# Patient Record
Sex: Male | Born: 1955 | Race: Black or African American | Hispanic: No | Marital: Married | Smoking: Never smoker
Health system: Southern US, Community
[De-identification: ages and names within clinical notes are randomized; demographics above are authoritative.]

## PROBLEM LIST (undated history)

## (undated) DIAGNOSIS — E785 Hyperlipidemia, unspecified: Secondary | ICD-10-CM

## (undated) DIAGNOSIS — I10 Essential (primary) hypertension: Secondary | ICD-10-CM

## (undated) DIAGNOSIS — E119 Type 2 diabetes mellitus without complications: Secondary | ICD-10-CM

---

## 1997-07-06 ENCOUNTER — Emergency Department (HOSPITAL_COMMUNITY): Admission: EM | Admit: 1997-07-06 | Discharge: 1997-07-06 | Payer: Self-pay | Admitting: Emergency Medicine

## 1997-08-14 ENCOUNTER — Emergency Department (HOSPITAL_COMMUNITY): Admission: EM | Admit: 1997-08-14 | Discharge: 1997-08-14 | Payer: Self-pay

## 1997-08-21 ENCOUNTER — Encounter: Admission: RE | Admit: 1997-08-21 | Discharge: 1997-11-19 | Payer: Self-pay | Admitting: Family Medicine

## 1998-03-30 ENCOUNTER — Encounter: Payer: Self-pay | Admitting: Family Medicine

## 1998-03-30 ENCOUNTER — Observation Stay (HOSPITAL_COMMUNITY): Admission: EM | Admit: 1998-03-30 | Discharge: 1998-04-01 | Payer: Self-pay | Admitting: Emergency Medicine

## 1998-04-01 ENCOUNTER — Encounter: Payer: Self-pay | Admitting: Interventional Cardiology

## 2005-06-09 ENCOUNTER — Emergency Department (HOSPITAL_COMMUNITY): Admission: EM | Admit: 2005-06-09 | Discharge: 2005-06-10 | Payer: Self-pay | Admitting: Emergency Medicine

## 2007-09-06 ENCOUNTER — Emergency Department: Payer: Self-pay | Admitting: Emergency Medicine

## 2015-04-10 ENCOUNTER — Emergency Department
Admission: EM | Admit: 2015-04-10 | Discharge: 2015-04-10 | Disposition: A | Discharge status: Home / Self Care | Payer: BLUE CROSS/BLUE SHIELD | Attending: Emergency Medicine | Admitting: Emergency Medicine

## 2015-04-10 ENCOUNTER — Encounter: Payer: Self-pay | Admitting: Emergency Medicine

## 2015-04-10 DIAGNOSIS — E1165 Type 2 diabetes mellitus with hyperglycemia: Secondary | ICD-10-CM | POA: Diagnosis present

## 2015-04-10 DIAGNOSIS — E138 Other specified diabetes mellitus with unspecified complications: Secondary | ICD-10-CM

## 2015-04-10 LAB — COMPREHENSIVE METABOLIC PANEL
ALBUMIN: 4 g/dL (ref 3.5–5.0)
ALT: 29 U/L (ref 17–63)
AST: 27 U/L (ref 15–41)
Alkaline Phosphatase: 67 U/L (ref 38–126)
Anion gap: 10 (ref 5–15)
BUN: 13 mg/dL (ref 6–20)
CHLORIDE: 98 mmol/L — AB (ref 101–111)
CO2: 24 mmol/L (ref 22–32)
Calcium: 9.1 mg/dL (ref 8.9–10.3)
Creatinine, Ser: 1.13 mg/dL (ref 0.61–1.24)
GFR calc Af Amer: >60 mL/min (ref >60)
GFR calc non Af Amer: >60 mL/min (ref >60)
GLUCOSE: 412 mg/dL — AB (ref 65–99)
POTASSIUM: 4 mmol/L (ref 3.5–5.1)
SODIUM: 132 mmol/L — AB (ref 135–145)
Total Bilirubin: 0.3 mg/dL (ref 0.3–1.2)
Total Protein: 8 g/dL (ref 6.5–8.1)

## 2015-04-10 LAB — URINALYSIS COMPLETE WITH MICROSCOPIC (ARMC ONLY)
BACTERIA UA: NONE SEEN
Bilirubin Urine: NEGATIVE
Glucose, UA: >500 mg/dL — AB
Hgb urine dipstick: NEGATIVE
LEUKOCYTES UA: NEGATIVE
Nitrite: NEGATIVE
PH: 6 (ref 5.0–8.0)
PROTEIN: NEGATIVE mg/dL
SQUAMOUS EPITHELIAL / LPF: NONE SEEN
Specific Gravity, Urine: 1.027 (ref 1.005–1.030)

## 2015-04-10 LAB — CBC
HEMATOCRIT: 41 % (ref 40.0–52.0)
Hemoglobin: 13.1 g/dL (ref 13.0–18.0)
MCH: 23.1 pg — AB (ref 26.0–34.0)
MCHC: 32 g/dL (ref 32.0–36.0)
MCV: 72.2 fL — AB (ref 80.0–100.0)
Platelets: 470 10*3/uL — ABNORMAL HIGH (ref 150–440)
RBC: 5.68 MIL/uL (ref 4.40–5.90)
RDW: 15.1 % — ABNORMAL HIGH (ref 11.5–14.5)
WBC: 6.3 10*3/uL (ref 3.8–10.6)

## 2015-04-10 LAB — GLUCOSE, CAPILLARY
GLUCOSE-CAPILLARY: 388 mg/dL — AB (ref 65–99)
Glucose-Capillary: 336 mg/dL — ABNORMAL HIGH (ref 65–99)
Glucose-Capillary: 277 mg/dL — ABNORMAL HIGH (ref 65–99)

## 2015-04-10 MED ORDER — SODIUM CHLORIDE 0.9 % IV SOLN
Freq: Once | INTRAVENOUS | Status: AC
  Administered 2015-04-10: 18:00:00 via INTRAVENOUS

## 2015-04-10 MED ORDER — SODIUM CHLORIDE 0.9 % IV SOLN
Freq: Once | INTRAVENOUS | Status: AC
  Administered 2015-04-10: 20:00:00 via INTRAVENOUS

## 2015-04-10 MED ORDER — METFORMIN HCL 500 MG PO TABS: 500.0000 mg | ORAL_TABLET | Freq: Two times a day (BID) | ORAL | Status: DC

## 2015-04-10 NOTE — ED Notes (Signed)
Patient to ER for c/o hyperglycemia (400 range) and HTN. Patient denies history of HTN.

## 2015-04-10 NOTE — Discharge Instructions (Signed)
Please seek medical attention for any high fevers, chest pain, shortness of breath, change in behavior, persistent vomiting, bloody stool or any other new or concerning symptoms.  Type 2 Diabetes Mellitus, Adult Type 2 diabetes mellitus, often simply referred to as type 2 diabetes, is a long-lasting (chronic) disease. In type 2 diabetes, the pancreas does not make enough insulin (a hormone), the cells are less responsive to the insulin that is made (insulin resistance), or both. Normally, insulin moves sugars from food into the tissue cells. The tissue cells use the sugars for energy. The lack of insulin or the lack of normal response to insulin causes excess sugars to build up in the blood instead of going into the tissue cells. As a result, high blood sugar (hyperglycemia) develops. The effect of high sugar (glucose) levels can cause many complications. Type 2 diabetes was also previously called adult-onset diabetes, but it can occur at any age.  RISK FACTORS  A person is predisposed to developing type 2 diabetes if someone in the family has the disease and also has one or more of the following primary risk factors:  Weight gain, or being overweight or obese.  An inactive lifestyle.  A history of consistently eating high-calorie foods. Maintaining a normal weight and regular physical activity can reduce the chance of developing type 2 diabetes. SYMPTOMS  A person with type 2 diabetes may not show symptoms initially. The symptoms of type 2 diabetes appear slowly. The symptoms include:  Increased thirst (polydipsia).  Increased urination (polyuria).  Increased urination during the night (nocturia).  Sudden or unexplained weight changes.  Frequent, recurring infections.  Tiredness (fatigue).  Weakness.  Vision changes, such as blurred vision.  Fruity smell to your breath.  Abdominal pain.  Nausea or vomiting.  Cuts or bruises which are slow to heal.  Tingling or numbness in  the hands or feet.  An open skin wound (ulcer). DIAGNOSIS Type 2 diabetes is frequently not diagnosed until complications of diabetes are present. Type 2 diabetes is diagnosed when symptoms or complications are present and when blood glucose levels are increased. Your blood glucose level may be checked by one or more of the following blood tests:  A fasting blood glucose test. You will not be allowed to eat for at least 8 hours before a blood sample is taken.  A random blood glucose test. Your blood glucose is checked at any time of the day regardless of when you ate.  A hemoglobin A1c blood glucose test. A hemoglobin A1c test provides information about blood glucose control over the previous 3 months.  An oral glucose tolerance test (OGTT). Your blood glucose is measured after you have not eaten (fasted) for 2 hours and then after you drink a glucose-containing beverage. TREATMENT   You may need to take insulin or diabetes medicine daily to keep blood glucose levels in the desired range.  If you use insulin, you may need to adjust the dosage depending on the carbohydrates that you eat with each meal or snack.  Lifestyle changes are recommended as part of your treatment. These may include:  Following an individualized diet plan developed by a nutritionist or dietitian.  Exercising daily. Your health care providers will set individualized treatment goals for you based on your age, your medicines, how long you have had diabetes, and any other medical conditions you have. Generally, the goal of treatment is to maintain the following blood glucose levels:  Before meals (preprandial): 80-130 mg/dL.  After meals (postprandial):  below 180 mg/dL.  A1c: less than 6.5-7%. HOME CARE INSTRUCTIONS   Have your hemoglobin A1c level checked twice a year.  Perform daily blood glucose monitoring as directed by your health care provider.  Monitor urine ketones when you are ill and as directed by  your health care provider.  Take your diabetes medicine or insulin as directed by your health care provider to maintain your blood glucose levels in the desired range.  Never run out of diabetes medicine or insulin. It is needed every day.  If you are using insulin, you may need to adjust the amount of insulin given based on your intake of carbohydrates. Carbohydrates can raise blood glucose levels but need to be included in your diet. Carbohydrates provide vitamins, minerals, and fiber which are an essential part of a healthy diet. Carbohydrates are found in fruits, vegetables, whole grains, dairy products, legumes, and foods containing added sugars.  Eat healthy foods. You should make an appointment to see a registered dietitian to help you create an eating plan that is right for you.  Lose weight if you are overweight.  Carry a medical alert card or wear your medical alert jewelry.  Carry a 15-gram carbohydrate snack with you at all times to treat low blood glucose (hypoglycemia). Some examples of 15-gram carbohydrate snacks include:  Glucose tablets, 3 or 4.  Glucose gel, 15-gram tube.  Raisins, 2 tablespoons (24 grams).  Jelly beans, 6.  Animal crackers, 8.  Regular pop, 4 ounces (120 mL).  Gummy treats, 9.  Recognize hypoglycemia. Hypoglycemia occurs with blood glucose levels of 70 mg/dL and below. The risk for hypoglycemia increases when fasting or skipping meals, during or after intense exercise, and during sleep. Hypoglycemia symptoms can include:  Tremors or shakes.  Decreased ability to concentrate.  Sweating.  Increased heart rate.  Headache.  Dry mouth.  Hunger.  Irritability.  Anxiety.  Restless sleep.  Altered speech or coordination.  Confusion.  Treat hypoglycemia promptly. If you are alert and able to safely swallow, follow the 15:15 rule:  Take 15-20 grams of rapid-acting glucose or carbohydrate. Rapid-acting options include glucose gel,  glucose tablets, or 4 ounces (120 mL) of fruit juice, regular soda, or low-fat milk.  Check your blood glucose level 15 minutes after taking the glucose.  Take 15-20 grams more of glucose if the repeat blood glucose level is still 70 mg/dL or below.  Eat a meal or snack within 1 hour once blood glucose levels return to normal.  Be alert to feeling very thirsty and urinating more frequently than usual, which are early signs of hyperglycemia. An early awareness of hyperglycemia allows for prompt treatment. Treat hyperglycemia as directed by your health care provider.  Engage in at least 150 minutes of moderate-intensity physical activity a week, spread over at least 3 days of the week or as directed by your health care provider. In addition, you should engage in resistance exercise at least 2 times a week or as directed by your health care provider. Try to spend no more than 90 minutes at one time inactive.  Adjust your medicine and food intake as needed if you start a new exercise or sport.  Follow your sick-day plan anytime you are unable to eat or drink as usual.  Do not use any tobacco products including cigarettes, chewing tobacco, or electronic cigarettes. If you need help quitting, ask your health care provider.  Limit alcohol intake to no more than 1 drink per day for nonpregnant women  and 2 drinks per day for men. You should drink alcohol only when you are also eating food. Talk with your health care provider whether alcohol is safe for you. Tell your health care provider if you drink alcohol several times a week.  Keep all follow-up visits as directed by your health care provider. This is important.  Schedule an eye exam soon after the diagnosis of type 2 diabetes and then annually.  Perform daily skin and foot care. Examine your skin and feet daily for cuts, bruises, redness, nail problems, bleeding, blisters, or sores. A foot exam by a health care provider should be done  annually.  Brush your teeth and gums at least twice a day and floss at least once a day. Follow up with your dentist regularly.  Share your diabetes management plan with your workplace or school.  Keep your immunizations up to date. It is recommended that you receive a flu (influenza) vaccine every year. It is also recommended that you receive a pneumonia (pneumococcal) vaccine. If you are 37 years of age or older and have never received a pneumonia vaccine, this vaccine may be given as a series of two separate shots. Ask your health care provider which additional vaccines may be recommended.  Learn to manage stress.  Obtain ongoing diabetes education and support as needed.  Participate in or seek rehabilitation as needed to maintain or improve independence and quality of life. Request a physical or occupational therapy referral if you are having foot or hand numbness, or difficulties with grooming, dressing, eating, or physical activity. SEEK MEDICAL CARE IF:   You are unable to eat food or drink fluids for more than 6 hours.  You have nausea and vomiting for more than 6 hours.  Your blood glucose level is over 240 mg/dL.  There is a change in mental status.  You develop an additional serious illness.  You have diarrhea for more than 6 hours.  You have been sick or have had a fever for a couple of days and are not getting better.  You have pain during any physical activity.  SEEK IMMEDIATE MEDICAL CARE IF:  You have difficulty breathing.  You have moderate to large ketone levels.   This information is not intended to replace advice given to you by your health care provider. Make sure you discuss any questions you have with your health care provider.   Document Released: 01/17/2005 Document Revised: 10/08/2014 Document Reviewed: 08/16/2011 Elsevier Interactive Patient Education [employer]

## 2015-04-10 NOTE — ED Notes (Signed)
Pt denies chest pain. Pt is resting with NAD at this time.

## 2015-04-10 NOTE — ED Notes (Signed)
Patient also states he "blacked out" today. Note from [facility] states A1C today was >13, blood sugar 400.

## 2015-04-10 NOTE — ED Provider Notes (Signed)
[facility] Emergency Department Provider Note   ____________________________________________  Time seen: ~2100  I have reviewed the triage vital signs and the nursing notes.   HISTORY  Chief Complaint Hyperglycemia   History limited by: Not Limited   HPI Danny Todd is a 60 y.o. male who presents to the emergency department today because of concerns for elevated blood sugar and elevated A1c. The patient had these blood tests done by home health nurse.The patient states that for the past month or 2 he has noticed increased thirst and urination. No addition he has had roughly a 20 pound weight loss. Patient states he does have history of diabetes in his family although he himself has never personally been diagnosed with this. Patient does state he has not seen a primary care doctor and a number of years. He denies any abdominal pain. Denies any fevers. Denies any chest pain or shortness breath.    History reviewed. No pertinent past medical history.  There are no active problems to display for this patient.   History reviewed. No pertinent past surgical history.  No current outpatient prescriptions on file.  Allergies Review of patient's allergies indicates no known allergies.  No family history on file.  Social History Social History  Substance Use Topics  . Smoking status: Never Smoker   . Smokeless tobacco: None  . Alcohol Use: No    Review of Systems  Constitutional: Negative for fever. Positive for weight loss. Cardiovascular: Negative for chest pain. Respiratory: Negative for shortness of breath. Gastrointestinal: Negative for abdominal pain, vomiting and diarrhea. Neurological: Negative for headaches, focal weakness or numbness.   10-point ROS otherwise negative.  ____________________________________________   PHYSICAL EXAM:  VITAL SIGNS: ED Triage Vitals  Enc Vitals Group     BP 04/10/15 1745 185/114 mmHg     Pulse  Rate 04/10/15 1745 117     Resp 04/10/15 1854 20     Temp 04/10/15 1745 98.3 F (36.8 C)     Temp Source 04/10/15 1745 Oral     SpO2 04/10/15 1745 96 %     Weight 04/10/15 1745 230 lb (104.327 kg)     Height 04/10/15 1745  (1.753 m)   Constitutional: Alert and oriented. Well appearing and in no distress. Eyes: Conjunctivae are normal. PERRL. Normal extraocular movements. ENT   Head: Normocephalic and atraumatic.   Nose: No congestion/rhinnorhea.   Mouth/Throat: Mucous membranes are moist.   Neck: No stridor. Hematological/Lymphatic/Immunilogical: No cervical lymphadenopathy. Cardiovascular: Normal rate, regular rhythm.  No murmurs, rubs, or gallops. Respiratory: Normal respiratory effort without tachypnea nor retractions. Breath sounds are clear and equal bilaterally. No wheezes/rales/rhonchi. Gastrointestinal: Soft and nontender. No distention. There is no CVA tenderness. Genitourinary: Deferred Musculoskeletal: Normal range of motion in all extremities. No joint effusions.  No lower extremity tenderness nor edema. Neurologic:  Normal speech and language. No gross focal neurologic deficits are appreciated.  Skin:  Skin is warm, dry and intact. No rash noted. Psychiatric: Mood and affect are normal. Speech and behavior are normal. Patient exhibits appropriate insight and judgment.  ____________________________________________    LABS (pertinent positives/negatives)  Labs Reviewed  CBC - Abnormal; Notable for the following:    MCV 72.2 (*)    MCH 23.1 (*)    RDW 15.1 (*)    Platelets 470 (*)    All other components within normal limits  URINALYSIS COMPLETEWITH MICROSCOPIC ([facility] ONLY) - Abnormal; Notable for the following:    Color, Urine STRAW (*)  APPearance CLEAR (*)    Glucose, UA >500 (*)    Ketones, ur TRACE (*)    All other components within normal limits  COMPREHENSIVE METABOLIC PANEL - Abnormal; Notable for the following:    Sodium 132 (*)     Chloride 98 (*)    Glucose, Bld 412 (*)    All other components within normal limits  GLUCOSE, CAPILLARY - Abnormal; Notable for the following:    Glucose-Capillary 388 (*)    All other components within normal limits  GLUCOSE, CAPILLARY - Abnormal; Notable for the following:    Glucose-Capillary 336 (*)    All other components within normal limits  GLUCOSE, CAPILLARY - Abnormal; Notable for the following:    Glucose-Capillary 277 (*)    All other components within normal limits  CBG MONITORING, ED     ____________________________________________   EKG  I, Phineas SemenGraydon Eliakim Tendler, attending physician, personally viewed and interpreted this EKG  EKG Time: 1757 Rate: 100 Rhythm: sinus tachycardia w/ pvc Axis: normal Intervals: qtc 448 QRS: narrow ST changes: no st elevation Impression: abnormal ekg  ____________________________________________    RADIOLOGY  None   ____________________________________________   PROCEDURES  Procedure(s) performed: None  Critical Care performed: No  ____________________________________________   INITIAL IMPRESSION / ASSESSMENT AND PLAN / ED COURSE  Pertinent labs & imaging results that were available during my care of the patient were reviewed by me and considered in my medical decision making (see chart for details).  Patient presented to the emergency department today because of concerns for elevated sugar. Blood work here does show hyperglycemia although no signs of DKA. Patient without a history of diabetes. Will plan on starting patient on metformin. Discussed GI complications. Will additionally give patient resources for primary care follow-up.  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

## 2016-10-21 ENCOUNTER — Inpatient Hospital Stay
Admission: EM | Admit: 2016-10-21 | Discharge: 2016-10-23 | DRG: 871 | Disposition: A | Discharge status: Home / Self Care | Payer: Managed Care, Other (non HMO) | Attending: Internal Medicine | Admitting: Internal Medicine

## 2016-10-21 ENCOUNTER — Emergency Department: Payer: Managed Care, Other (non HMO)

## 2016-10-21 ENCOUNTER — Encounter: Payer: Self-pay | Admitting: Emergency Medicine

## 2016-10-21 DIAGNOSIS — N170 Acute kidney failure with tubular necrosis: Secondary | ICD-10-CM | POA: Diagnosis present

## 2016-10-21 DIAGNOSIS — J129 Viral pneumonia, unspecified: Secondary | ICD-10-CM | POA: Diagnosis present

## 2016-10-21 DIAGNOSIS — I10 Essential (primary) hypertension: Secondary | ICD-10-CM | POA: Diagnosis present

## 2016-10-21 DIAGNOSIS — R7989 Other specified abnormal findings of blood chemistry: Secondary | ICD-10-CM

## 2016-10-21 DIAGNOSIS — Z7984 Long term (current) use of oral hypoglycemic drugs: Secondary | ICD-10-CM

## 2016-10-21 DIAGNOSIS — Z23 Encounter for immunization: Secondary | ICD-10-CM

## 2016-10-21 DIAGNOSIS — E785 Hyperlipidemia, unspecified: Secondary | ICD-10-CM | POA: Diagnosis present

## 2016-10-21 DIAGNOSIS — E860 Dehydration: Secondary | ICD-10-CM | POA: Diagnosis present

## 2016-10-21 DIAGNOSIS — A419 Sepsis, unspecified organism: Principal | ICD-10-CM | POA: Diagnosis present

## 2016-10-21 DIAGNOSIS — Z7982 Long term (current) use of aspirin: Secondary | ICD-10-CM

## 2016-10-21 DIAGNOSIS — Z79899 Other long term (current) drug therapy: Secondary | ICD-10-CM | POA: Diagnosis not present

## 2016-10-21 DIAGNOSIS — E119 Type 2 diabetes mellitus without complications: Secondary | ICD-10-CM | POA: Diagnosis present

## 2016-10-21 DIAGNOSIS — E871 Hypo-osmolality and hyponatremia: Secondary | ICD-10-CM | POA: Diagnosis present

## 2016-10-21 DIAGNOSIS — R945 Abnormal results of liver function studies: Secondary | ICD-10-CM

## 2016-10-21 DIAGNOSIS — N179 Acute kidney failure, unspecified: Secondary | ICD-10-CM

## 2016-10-21 DIAGNOSIS — J189 Pneumonia, unspecified organism: Secondary | ICD-10-CM

## 2016-10-21 HISTORY — DX: Hyperlipidemia, unspecified: E78.5

## 2016-10-21 HISTORY — DX: Essential (primary) hypertension: I10

## 2016-10-21 HISTORY — DX: Type 2 diabetes mellitus without complications: E11.9

## 2016-10-21 LAB — COMPREHENSIVE METABOLIC PANEL
ALK PHOS: 70 U/L (ref 38–126)
ALK PHOS: 59 U/L (ref 38–126)
ALT: 116 U/L — AB (ref 17–63)
ALT: 98 U/L — AB (ref 17–63)
AST: 135 U/L — ABNORMAL HIGH (ref 15–41)
AST: 107 U/L — ABNORMAL HIGH (ref 15–41)
Albumin: 4.1 g/dL (ref 3.5–5.0)
Albumin: 3.3 g/dL — ABNORMAL LOW (ref 3.5–5.0)
Anion gap: 12 (ref 5–15)
Anion gap: 7 (ref 5–15)
BILIRUBIN TOTAL: 0.9 mg/dL (ref 0.3–1.2)
BUN: 25 mg/dL — ABNORMAL HIGH (ref 6–20)
BUN: 23 mg/dL — ABNORMAL HIGH (ref 6–20)
CALCIUM: 9 mg/dL (ref 8.9–10.3)
CALCIUM: 7.7 mg/dL — AB (ref 8.9–10.3)
CO2: 24 mmol/L (ref 22–32)
CO2: 25 mmol/L (ref 22–32)
CREATININE: 2.57 mg/dL — AB (ref 0.61–1.24)
CREATININE: 1.85 mg/dL — AB (ref 0.61–1.24)
Chloride: 96 mmol/L — ABNORMAL LOW (ref 101–111)
Chloride: 102 mmol/L (ref 101–111)
GFR calc non Af Amer: 25 mL/min — ABNORMAL LOW (ref >60)
GFR, EST AFRICAN AMERICAN: 29 mL/min — AB (ref >60)
GFR, EST AFRICAN AMERICAN: 43 mL/min — AB (ref >60)
GFR, EST NON AFRICAN AMERICAN: 37 mL/min — AB (ref >60)
GLUCOSE: 162 mg/dL — AB (ref 65–99)
Glucose, Bld: 153 mg/dL — ABNORMAL HIGH (ref 65–99)
Potassium: 4.3 mmol/L (ref 3.5–5.1)
Potassium: 4.1 mmol/L (ref 3.5–5.1)
Sodium: 132 mmol/L — ABNORMAL LOW (ref 135–145)
Sodium: 134 mmol/L — ABNORMAL LOW (ref 135–145)
TOTAL PROTEIN: 8.4 g/dL — AB (ref 6.5–8.1)
Total Bilirubin: 0.4 mg/dL (ref 0.3–1.2)
Total Protein: 6.7 g/dL (ref 6.5–8.1)

## 2016-10-21 LAB — CBC WITH DIFFERENTIAL/PLATELET
Basophils Absolute: 0 10*3/uL (ref 0–0.1)
Basophils Relative: 1 %
EOS ABS: 0 10*3/uL (ref 0–0.7)
Eosinophils Relative: 0 %
HCT: 40.1 % (ref 40.0–52.0)
Hemoglobin: 13.2 g/dL (ref 13.0–18.0)
Lymphocytes Relative: 14 %
Lymphs Abs: 0.5 10*3/uL — ABNORMAL LOW (ref 1.0–3.6)
MCH: 23 pg — ABNORMAL LOW (ref 26.0–34.0)
MCHC: 33 g/dL (ref 32.0–36.0)
MCV: 69.7 fL — ABNORMAL LOW (ref 80.0–100.0)
MONO ABS: 0.5 10*3/uL (ref 0.2–1.0)
MONOS PCT: 15 %
NEUTROS PCT: 70 %
Neutro Abs: 2.5 10*3/uL (ref 1.4–6.5)
Platelets: 195 10*3/uL (ref 150–440)
RBC: 5.76 MIL/uL (ref 4.40–5.90)
RDW: 14.6 % — AB (ref 11.5–14.5)
WBC: 3.5 10*3/uL — ABNORMAL LOW (ref 3.8–10.6)

## 2016-10-21 LAB — URINALYSIS, COMPLETE (UACMP) WITH MICROSCOPIC
BILIRUBIN URINE: NEGATIVE
Glucose, UA: NEGATIVE mg/dL
HGB URINE DIPSTICK: NEGATIVE
KETONES UR: NEGATIVE mg/dL
Leukocytes, UA: NEGATIVE
NITRITE: NEGATIVE
PH: 5 (ref 5.0–8.0)
Protein, ur: 100 mg/dL — AB
SPECIFIC GRAVITY, URINE: 1.017 (ref 1.005–1.030)

## 2016-10-21 LAB — PROTIME-INR
INR: 1.09
PROTHROMBIN TIME: 14 s (ref 11.4–15.2)

## 2016-10-21 LAB — INFLUENZA PANEL BY PCR (TYPE A & B)
INFLAPCR: NEGATIVE
INFLBPCR: NEGATIVE

## 2016-10-21 LAB — PROCALCITONIN: Procalcitonin: 1.01 ng/mL

## 2016-10-21 LAB — LACTIC ACID, PLASMA: Lactic Acid, Venous: 1.8 mmol/L (ref 0.5–1.9)

## 2016-10-21 LAB — GLUCOSE, CAPILLARY: Glucose-Capillary: 147 mg/dL — ABNORMAL HIGH (ref 65–99)

## 2016-10-21 LAB — TROPONIN I: Troponin I: <0.03 ng/mL (ref <0.03)

## 2016-10-21 LAB — LIPASE, BLOOD: Lipase: 28 U/L (ref 11–51)

## 2016-10-21 MED ORDER — INFLUENZA VAC SPLIT QUAD 0.5 ML IM SUSY
0.5000 mL | PREFILLED_SYRINGE | INTRAMUSCULAR | Status: AC
  Administered 2016-10-23: 0.5 mL via INTRAMUSCULAR
  Filled 2016-10-21: qty 0.5

## 2016-10-21 MED ORDER — ASPIRIN EC 81 MG PO TBEC
81.0000 mg | DELAYED_RELEASE_TABLET | Freq: Every day | ORAL | Status: DC
  Administered 2016-10-21 – 2016-10-23 (×3): 81 mg via ORAL
  Filled 2016-10-21 (×3): qty 1

## 2016-10-21 MED ORDER — INSULIN ASPART 100 UNIT/ML ~~LOC~~ SOLN
0.0000 [IU] | Freq: Three times a day (TID) | SUBCUTANEOUS | Status: DC
  Administered 2016-10-22 (×2): 2 [IU] via SUBCUTANEOUS
  Administered 2016-10-23: 1 [IU] via SUBCUTANEOUS
  Filled 2016-10-21 (×3): qty 1

## 2016-10-21 MED ORDER — ONDANSETRON HCL 4 MG/2ML IJ SOLN
4.0000 mg | Freq: Four times a day (QID) | INTRAMUSCULAR | Status: DC | PRN
  Administered 2016-10-22: 4 mg via INTRAVENOUS
  Filled 2016-10-21: qty 2

## 2016-10-21 MED ORDER — PNEUMOCOCCAL VAC POLYVALENT 25 MCG/0.5ML IJ INJ
0.5000 mL | INJECTION | INTRAMUSCULAR | Status: AC
  Administered 2016-10-23: 0.5 mL via INTRAMUSCULAR
  Filled 2016-10-21: qty 0.5

## 2016-10-21 MED ORDER — ONDANSETRON HCL 4 MG PO TABS: 4.0000 mg | ORAL_TABLET | Freq: Four times a day (QID) | ORAL | Status: DC | PRN

## 2016-10-21 MED ORDER — CEFTRIAXONE SODIUM IN DEXTROSE 20 MG/ML IV SOLN
1.0000 g | Freq: Once | INTRAVENOUS | Status: AC
  Administered 2016-10-21: 1 g via INTRAVENOUS
  Filled 2016-10-21: qty 50

## 2016-10-21 MED ORDER — HEPARIN SODIUM (PORCINE) 5000 UNIT/ML IJ SOLN
5000.0000 [IU] | Freq: Three times a day (TID) | INTRAMUSCULAR | Status: DC
  Administered 2016-10-21 – 2016-10-22 (×2): 5000 [IU] via SUBCUTANEOUS
  Filled 2016-10-21 (×2): qty 1

## 2016-10-21 MED ORDER — INSULIN ASPART 100 UNIT/ML ~~LOC~~ SOLN: 0.0000 [IU] | Freq: Every day | SUBCUTANEOUS | Status: DC

## 2016-10-21 MED ORDER — DEXTROSE 5 % IV SOLN
500.0000 mg | Freq: Once | INTRAVENOUS | Status: AC
  Administered 2016-10-21: 500 mg via INTRAVENOUS
  Filled 2016-10-21: qty 500

## 2016-10-21 MED ORDER — DEXTROSE 5 % IV SOLN
500.0000 mg | INTRAVENOUS | Status: DC
  Administered 2016-10-22: 500 mg via INTRAVENOUS
  Filled 2016-10-21 (×2): qty 500

## 2016-10-21 MED ORDER — DEXTROSE 5 % IV SOLN
1.0000 g | INTRAVENOUS | Status: DC
  Administered 2016-10-22: 1 g via INTRAVENOUS
  Filled 2016-10-21 (×2): qty 10

## 2016-10-21 MED ORDER — BISACODYL 5 MG PO TBEC: 5.0000 mg | DELAYED_RELEASE_TABLET | Freq: Every day | ORAL | Status: DC | PRN

## 2016-10-21 MED ORDER — SODIUM CHLORIDE 0.9 % IV BOLUS (SEPSIS)
30.0000 mL/kg | Freq: Once | INTRAVENOUS | Status: AC
  Administered 2016-10-21: 2448 mL via INTRAVENOUS

## 2016-10-21 MED ORDER — SENNOSIDES-DOCUSATE SODIUM 8.6-50 MG PO TABS: 1.0000 | ORAL_TABLET | Freq: Every evening | ORAL | Status: DC | PRN

## 2016-10-21 MED ORDER — ACETAMINOPHEN 650 MG RE SUPP: 650.0000 mg | Freq: Four times a day (QID) | RECTAL | Status: DC | PRN

## 2016-10-21 MED ORDER — ACETAMINOPHEN 325 MG PO TABS
650.0000 mg | ORAL_TABLET | Freq: Four times a day (QID) | ORAL | Status: DC | PRN
  Administered 2016-10-22 (×2): 650 mg via ORAL
  Filled 2016-10-21 (×3): qty 2

## 2016-10-21 MED ORDER — HYDROCODONE-ACETAMINOPHEN 5-325 MG PO TABS
1.0000 | ORAL_TABLET | ORAL | Status: DC | PRN
  Administered 2016-10-22: 1 via ORAL
  Filled 2016-10-21: qty 1

## 2016-10-21 MED ORDER — ACETAMINOPHEN 325 MG PO TABS
650.0000 mg | ORAL_TABLET | Freq: Once | ORAL | Status: AC | PRN
  Administered 2016-10-21: 650 mg via ORAL
  Filled 2016-10-21: qty 2

## 2016-10-21 MED ORDER — SODIUM CHLORIDE 0.9 % IV SOLN
INTRAVENOUS | Status: DC
  Administered 2016-10-21 – 2016-10-22 (×2): via INTRAVENOUS

## 2016-10-21 MED ORDER — ALBUTEROL SULFATE (2.5 MG/3ML) 0.083% IN NEBU: 2.5000 mg | INHALATION_SOLUTION | RESPIRATORY_TRACT | Status: DC | PRN

## 2016-10-21 NOTE — ED Triage Notes (Signed)
Patient presents to the ED with weakness, shortness of breath and fever.  Patient states he hasn't felt well x 2 days, has had cough and congestion.  Patient denies chest pain, vomiting and diarrhea.  Patient speaking in full sentences without obvious difficulty.  Patient reports body aches.

## 2016-10-21 NOTE — ED Notes (Signed)
FIRST NURSE NOTE:  Pt states "I can't get no wind" pt states he thinks he was sold some bad seafood and states he has been sick for 2 days. Pt in w/c.

## 2016-10-21 NOTE — ED Notes (Signed)
Pt transport to 208

## 2016-10-21 NOTE — Progress Notes (Signed)
ANTIBIOTIC CONSULT NOTE - INITIAL  Pharmacy Consult for Ceftriaxone , Azithromycin  Indication: sepsis  No Known Allergies  Patient Measurements: Height:  (188 cm) Weight: 238 lb 11.2 oz (108.3 kg) IBW/kg (Calculated) : 82.2 Adjusted Body Weight:   Vital Signs: Temp: 99.1 F (37.3 C) (09/21 2036) Temp Source: Oral (09/21 2036) BP: 123/84 (09/21 2036) Pulse Rate: 86 (09/21 2036) Intake/Output from previous day: No intake/output data recorded. Intake/Output from this shift: No intake/output data recorded.  Labs:  Recent Labs  10/21/16 1654  WBC 3.5*  HGB 13.2  PLT 195  CREATININE 2.57*   Estimated Creatinine Clearance: 39 mL/min (A) (by C-G formula based on SCr of 2.57 mg/dL (H)). No results for input(s): VANCOTROUGH, VANCOPEAK, VANCORANDOM, GENTTROUGH, GENTPEAK, GENTRANDOM, TOBRATROUGH, TOBRAPEAK, TOBRARND, AMIKACINPEAK, AMIKACINTROU, AMIKACIN in the last 72 hours.   Microbiology: No results found for this or any previous visit (from the past 720 hour(s)).  Medical History: Past Medical History:  Diagnosis Date  . DM (diabetes mellitus) (HCC)   . Hyperlipidemia   . Hypertension     Medications:  Prescriptions Prior to Admission  Medication Sig Dispense Refill Last Dose  . aspirin EC 81 MG tablet Take 81-162 mg by mouth daily as needed for moderate pain.   PRN at PRN  . glimepiride (AMARYL) 4 MG tablet Take 4 mg by mouth daily.   10/20/2016 at am  . lisinopril (PRINIVIL,ZESTRIL) 10 MG tablet Take 10 mg by mouth daily.   10/20/2016 at am  . metFORMIN (GLUCOPHAGE) 1000 MG tablet Take 1,000 mg by mouth 2 (two) times daily with a meal.   10/20/2016 at pm  . simvastatin (ZOCOR) 20 MG tablet Take 20 mg by mouth daily.   10/20/2016 at am   Assessment: CrCl = 39 ml/min   Goal of Therapy:  resolution of infection  Plan:  Expected duration 7 days with resolution of temperature and/or normalization of WBC   Will continue ceftriaxone 1 gm IV Q24H to start on 9/22  @ 18:00 and azithromycin 500 mg IV Q24H on 9/22 @ 18:00.   Jannah Guardiola D 10/21/2016,9:24 PM

## 2016-10-21 NOTE — H&P (Signed)
[facility] [place] at [facility]   PATIENT NAME: Danny Todd    MR#:  [id number]  DATE OF BIRTH:  05/27/1955  DATE OF ADMISSION:  10/21/2016  PRIMARY CARE PHYSICIAN: Patient, No Pcp Per   REQUESTING/REFERRING PHYSICIAN: Merrily Brittle, MD  CHIEF COMPLAINT:   Chief Complaint  Patient presents with  . Fever  . Shortness of Breath   Fever, chills, cough and shortness breath for 3 days. HISTORY OF PRESENT ILLNESS:  Danny Todd  is a 61 y.o. male with a known history of Hypertension and diabetes. The patient has had fever, chills, cough and shortness breath for the past 3 days, he also complains of generalized weakness. He was found fever 102 and tachycardia in the ED. Chest x-ray show bilateral pneumonia. Sepsis protocol was started.  PAST MEDICAL HISTORY:   Past Medical History:  Diagnosis Date  . DM (diabetes mellitus) (HCC)   . Hyperlipidemia   . Hypertension     PAST SURGICAL HISTORY:  History reviewed. No pertinent surgical history. No surgical history.  SOCIAL HISTORY:   Social History  Substance Use Topics  . Smoking status: Never Smoker  . Smokeless tobacco: Never Used  . Alcohol use No    FAMILY HISTORY:   Family History  Problem Relation Age of Onset  . Diabetes Mother     DRUG ALLERGIES:  No Known Allergies  REVIEW OF SYSTEMS:   Review of Systems  Constitutional: Positive for chills, fever and malaise/fatigue.  HENT: Negative for sore throat.   Eyes: Negative for blurred vision and double vision.  Respiratory: Positive for cough, sputum production and shortness of breath. Negative for hemoptysis, wheezing and stridor.   Cardiovascular: Negative for chest pain, palpitations, orthopnea and leg swelling.  Gastrointestinal: Positive for nausea and vomiting. Negative for abdominal pain, blood in stool, diarrhea and melena.  Genitourinary: Negative for dysuria, flank pain and hematuria.  Musculoskeletal: Negative for back pain  and joint pain.  Skin: Negative for rash.  Neurological: Negative for dizziness, sensory change, focal weakness, seizures, loss of consciousness, weakness and headaches.  Endo/Heme/Allergies: Negative for polydipsia.  Psychiatric/Behavioral: Negative for depression. The patient is not nervous/anxious.     MEDICATIONS AT HOME:   Prior to Admission medications   Medication Sig Start Date End Date Taking? Authorizing Provider  aspirin EC 81 MG tablet Take 81-162 mg by mouth daily as needed for moderate pain.   Yes [provider]  glimepiride (AMARYL) 4 MG tablet Take 4 mg by mouth daily.   Yes [provider]  lisinopril (PRINIVIL,ZESTRIL) 10 MG tablet Take 10 mg by mouth daily.   Yes [provider]  metFORMIN (GLUCOPHAGE) 1000 MG tablet Take 1,000 mg by mouth 2 (two) times daily with a meal.   Yes [provider]  simvastatin (ZOCOR) 20 MG tablet Take 20 mg by mouth daily.   Yes [provider]      VITAL SIGNS:  Blood pressure 117/76, pulse 96, temperature 100 F (37.8 C), temperature source Oral, resp. rate 19, height  (1.88 m), weight 180 lb (81.6 kg), SpO2 95 %.  PHYSICAL EXAMINATION:  Physical Exam  GENERAL:  61 y.o.-year-old patient lying in the bed with no acute distress. Obese. EYES: Pupils equal, round, reactive to light and accommodation. No scleral icterus. Extraocular muscles intact.  HEENT: Head atraumatic, normocephalic. Oropharynx and nasopharynx clear.  NECK:  Supple, no jugular venous distention. No thyroid enlargement, no tenderness.  LUNGS: Normal breath sounds bilaterally, no  wheezing, mild crackles. No use of accessory muscles of respiration.  CARDIOVASCULAR: S1, S2 normal. No murmurs, rubs, or gallops.  ABDOMEN: Soft, nontender, nondistended. Bowel sounds present. No organomegaly or mass.  EXTREMITIES: No pedal edema, cyanosis, or clubbing.  NEUROLOGIC: Cranial nerves II through XII are intact. Muscle strength  5/5 in all extremities. Sensation intact. Gait not checked.  PSYCHIATRIC: The patient is alert and oriented x 3.  SKIN: No obvious rash, lesion, or ulcer.   LABORATORY PANEL:   CBC  Recent Labs Lab 10/21/16 1654  WBC 3.5*  HGB 13.2  HCT 40.1  PLT 195   ------------------------------------------------------------------------------------------------------------------  Chemistries   Recent Labs Lab 10/21/16 1654  NA 132*  K 4.3  CL 96*  CO2 24  GLUCOSE 162*  BUN 25*  CREATININE 2.57*  CALCIUM 9.0  AST 135*  ALT 116*  ALKPHOS 70  BILITOT 0.9   ------------------------------------------------------------------------------------------------------------------  Cardiac Enzymes  Recent Labs Lab 10/21/16 1654  TROPONINI <0.03   ------------------------------------------------------------------------------------------------------------------  RADIOLOGY:  Dg Chest 2 View  Result Date: 10/21/2016 CLINICAL DATA:  Weakness, shortness of breath and fever. Not feeling well 2 days with cough and congestion. EXAM: CHEST  2 VIEW COMPARISON:  None. FINDINGS: Lungs are hypoinflated with mild bibasilar opacification which may be due to atelectasis or infection. No definite effusion. Minimal prominence of the perihilar markings. Mild cardiomegaly. Minimal degenerate change of the spine. IMPRESSION: Bibasilar opacification which may be due to atelectasis or infection. Mild cardiomegaly with suggestion of mild vascular congestion. Electronically Signed   By: Elberta Fortis M.D.   On: 10/21/2016 17:56      IMPRESSION AND PLAN:   Sepsis due to pneumonia, CAP. The patient will be admitted to medical floor. Start sepsis protocol, Zithromax and Rocephin IV, follow-up CBC and cultures. Robitussin when necessary.  Acute renal failure due to dehydration and ATN. Hold lisinopril and metformin, start normal saline IV and follow-up BMP.  Hyponatremia. Start normal saline IV and follow-up  BMP. Abnormal liver function test. Unclear etiology. Abdominal ultrasound.  Hypertension. Hold lisinopril due to renal failure. Diabetes. Hold glipizide and metformin, start sliding scale and check hemoglobin A1c. Hyperlipidemia. Continue aspirin and hold Lipitor due to abnormal liver function tests.   All the records are reviewed and case discussed with ED provider. Management plans discussed with the patient, family and they are in agreement.  CODE STATUS: Full code  TOTAL TIME TAKING CARE OF THIS PATIENT: 55 minutes.    Shaune Pollack M.D on 10/21/2016 at 7:33 PM  Between 7am to 6pm - Pager - [phone]  After 6pm go to [email or web address] - Social research officer, government  [facility] [place] [facility]  Office  [phone]  CC: Primary care physician; Patient, No Pcp Per   Note: This dictation was prepared with Dragon dictation along with smaller phrase technology. Any transcriptional errors that result from this process are unin

## 2016-10-21 NOTE — ED Notes (Signed)
Called Code Sepsis to [facility]  [id number]

## 2016-10-21 NOTE — Progress Notes (Signed)
Pt refused sputum culture collection.

## 2016-10-21 NOTE — ED Notes (Signed)
Pt given meal tray and water 

## 2016-10-21 NOTE — ED Provider Notes (Signed)
[facility] Emergency Department Provider Note  ____________________________________________   First MD Initiated Contact with Patient 10/21/16 1755     (approximate)  I have reviewed the triage vital signs and the nursing notes.   HISTORY  Chief Complaint Fever and Shortness of Breath   HPI Danny Todd is a 61 y.o. male who self presents to the emergency department with fever, fatigue, shortness of breath, and productive cough for the past 2 days. He feels like his symptoms began suddenly and have been gradually progressive. His symptoms are worse with exertion and improved with rest. He did not get a flu shot this year. He has no sick contacts. He has moderate severity sharp diffuse upper chest pain worse with coughing and improved when not coughing.   Past Medical History:  Diagnosis Date  . DM (diabetes mellitus) (HCC)   . Hyperlipidemia   . Hypertension     Patient Active Problem List   Diagnosis Date Noted  . Sepsis (HCC) 10/21/2016    History reviewed. No pertinent surgical history.  Prior to Admission medications   Medication Sig Start Date End Date Taking? Authorizing Provider  aspirin EC 81 MG tablet Take 81-162 mg by mouth daily as needed for moderate pain.   Yes [provider]  glimepiride (AMARYL) 4 MG tablet Take 4 mg by mouth daily.   Yes [provider]  lisinopril (PRINIVIL,ZESTRIL) 10 MG tablet Take 10 mg by mouth daily.   Yes [provider]  metFORMIN (GLUCOPHAGE) 1000 MG tablet Take 1,000 mg by mouth 2 (two) times daily with a meal.   Yes [provider]  simvastatin (ZOCOR) 20 MG tablet Take 20 mg by mouth daily.   Yes [provider]    Allergies Patient has no known allergies.  Family History  Problem Relation Age of Onset  . Diabetes Mother     Social History Social History  Substance Use Topics  . Smoking status: Never Smoker  . Smokeless tobacco: Never Used  .  Alcohol use No    Review of Systems Constitutional: Positive fevers Eyes: No visual changes. ENT: No sore throat. Cardiovascular: Positive chest pain. Respiratory: Positive shortness of breath. Gastrointestinal: No abdominal pain.  Positive nausea, no vomiting.  No diarrhea.  No constipation. Genitourinary: Negative for dysuria. Musculoskeletal: Negative for back pain. Skin: Negative for rash. Neurological: Negative for headaches, focal weakness or numbness.   ____________________________________________   PHYSICAL EXAM:  VITAL SIGNS: ED Triage Vitals [10/21/16 1652]  Enc Vitals Group     BP 121/70     Pulse Rate (!) 116     Resp 18     Temp (!) 102.3 F (39.1 C)     Temp Source Oral     SpO2 95 %     Weight 180 lb (81.6 kg)     Height  (1.88 m)     Head Circumference      Peak Flow      Pain Score      Pain Loc      Pain Edu?      Excl. in GC?     Constitutional: Alert and oriented 4 appears clearly short of breath and speaking in short sentences has a wet cough Eyes: PERRL EOMI. Head: Atraumatic. Nose: No congestion/rhinnorhea. Mouth/Throat: No trismus Neck: No stridor.   Cardiovascular: Tachycardic rate, regular rhythm. Grossly normal heart sounds.  Good peripheral circulation. Respiratory: Increased respiratory effort with rales bilaterally moving good amounts of  air Gastrointestinal: Soft nontender Musculoskeletal: No lower extremity edema   Neurologic: No gross focal neurologic deficits are appreciated. Skin:  Skin is warm, dry and intact. No rash noted. Psychiatric: Mood and affect are normal. Speech and behavior are normal.    ____________________________________________   DIFFERENTIAL includes but not limited to  Pneumonia, influenza, COPD, pulmonary embolism, sepsis ____________________________________________   LABS (all labs ordered are listed, but only abnormal results are displayed)  Labs Reviewed  COMPREHENSIVE METABOLIC PANEL -  Abnormal; Notable for the following:       Result Value   Sodium 132 (*)    Chloride 96 (*)    Glucose, Bld 162 (*)    BUN 25 (*)    Creatinine, Ser 2.57 (*)    Total Protein 8.4 (*)    AST 135 (*)    ALT 116 (*)    GFR calc non Af Amer 25 (*)    GFR calc Af Amer 29 (*)    All other components within normal limits  CBC WITH DIFFERENTIAL/PLATELET - Abnormal; Notable for the following:    WBC 3.5 (*)    MCV 69.7 (*)    MCH 23.0 (*)    RDW 14.6 (*)    Lymphs Abs 0.5 (*)    All other components within normal limits  URINALYSIS, COMPLETE (UACMP) WITH MICROSCOPIC - Abnormal; Notable for the following:    Color, Urine AMBER (*)    APPearance HAZY (*)    Protein, ur 100 (*)    Bacteria, UA RARE (*)    Squamous Epithelial / LPF 0-5 (*)    All other components within normal limits  CBC - Abnormal; Notable for the following:    WBC 3.1 (*)    Hemoglobin 12.3 (*)    HCT 37.7 (*)    MCV 71.8 (*)    MCH 23.4 (*)    All other components within normal limits  HEMOGLOBIN A1C - Abnormal; Notable for the following:    Hgb A1c MFr Bld 8.6 (*)    All other components within normal limits  COMPREHENSIVE METABOLIC PANEL - Abnormal; Notable for the following:    Sodium 134 (*)    Glucose, Bld 153 (*)    BUN 23 (*)    Creatinine, Ser 1.85 (*)    Calcium 7.7 (*)    Albumin 3.3 (*)    AST 107 (*)    ALT 98 (*)    GFR calc non Af Amer 37 (*)    GFR calc Af Amer 43 (*)    All other components within normal limits  GLUCOSE, CAPILLARY - Abnormal; Notable for the following:    Glucose-Capillary 147 (*)    All other components within normal limits  GLUCOSE, CAPILLARY - Abnormal; Notable for the following:    Glucose-Capillary 119 (*)    All other components within normal limits  GLUCOSE, CAPILLARY - Abnormal; Notable for the following:    Glucose-Capillary 160 (*)    All other components within normal limits  CULTURE, BLOOD (ROUTINE X 2)  CULTURE, BLOOD (ROUTINE X 2)  URINE CULTURE    CULTURE, EXPECTORATED SPUTUM-ASSESSMENT  LACTIC ACID, PLASMA  INFLUENZA PANEL BY PCR (TYPE A & B)  LIPASE, BLOOD  TROPONIN I  PROCALCITONIN  PROTIME-INR  HIV ANTIBODY (ROUTINE TESTING)    Labs interpreted by me increased creatinine consistent with dehydration and acute kidney injury is influenza-negative low white blood cell count is concerning for sepsis __________________________________________  EKG   ____________________________________________  RADIOLOGY  Chest x-ray reviewed by me is consistent with bilateral pneumonia ____________________________________________   PROCEDURES  Procedure(s) performed: no  Procedures  Critical Care performed: yes  CRITICAL CARE Performed by: Merrily Brittle   Total critical care time: 30 minutes  Critical care time was exclusive of separately billable procedures and treating other patients.  Critical care was necessary to treat or prevent imminent or life-threatening deterioration.  Critical care was time spent personally by me on the following activities: development of treatment plan with patient and/or surrogate as well as nursing, discussions with consultants, evaluation of patient's response to treatment, examination of patient, obtaining history from patient or surrogate, ordering and performing treatments and interventions, ordering and review of laboratory studies, ordering and review of radiographic studies, pulse oximetry and re-evaluation of patient's condition.   Observation: no ____________________________________________   INITIAL IMPRESSION / ASSESSMENT AND PLAN / ED COURSE  Pertinent labs & imaging results that were available during my care of the patient were reviewed by me and considered in my medical decision making (see chart for details).  The patient arrives tachycardic, short of breath, febrile, with an elevated respiratory rate. He has rales in bilateral lungs and appears clinically sick. Blood work  confirms acute kidney injury consistent with significant dehydration. His low white blood cell count is concerning for bacterial infection. At this point I have initiated a sepsis protocol and as it is early in the flu season we will check him for influenza as well as he is within the window for treatment. 30 cc/kg of fluid are pending.      The patient has not been hospitalized recently so I will treat him for community-acquired pneumonia. At this point given his acute kidney injury and dehydration he requires inpatient admission.  ____________________________________________   FINAL CLINICAL IMPRESSION(S) / ED DIAGNOSES  Final diagnoses:  Community acquired pneumonia, unspecified laterality  Sepsis, due to unspecified organism (HCC)  Acute kidney injury (HCC)      NEW MEDICATIONS STARTED DURING THIS VISIT:  Current Discharge Medication List       Note:  This document was prepared using Dragon voice recognition software and may include unintentional dictation errors.     Merrily Brittle, MD 10/22/16 1424

## 2016-10-22 ENCOUNTER — Inpatient Hospital Stay: Payer: Managed Care, Other (non HMO)

## 2016-10-22 LAB — GLUCOSE, CAPILLARY
GLUCOSE-CAPILLARY: 119 mg/dL — AB (ref 65–99)
GLUCOSE-CAPILLARY: 160 mg/dL — AB (ref 65–99)
Glucose-Capillary: 143 mg/dL — ABNORMAL HIGH (ref 65–99)
Glucose-Capillary: 154 mg/dL — ABNORMAL HIGH (ref 65–99)

## 2016-10-22 LAB — CBC
HEMATOCRIT: 37.7 % — AB (ref 40.0–52.0)
HEMOGLOBIN: 12.3 g/dL — AB (ref 13.0–18.0)
MCH: 23.4 pg — ABNORMAL LOW (ref 26.0–34.0)
MCHC: 32.6 g/dL (ref 32.0–36.0)
MCV: 71.8 fL — ABNORMAL LOW (ref 80.0–100.0)
Platelets: 162 10*3/uL (ref 150–440)
RBC: 5.24 MIL/uL (ref 4.40–5.90)
RDW: 14.4 % (ref 11.5–14.5)
WBC: 3.1 10*3/uL — AB (ref 3.8–10.6)

## 2016-10-22 LAB — HEMOGLOBIN A1C
HEMOGLOBIN A1C: 8.6 % — AB (ref 4.8–5.6)
MEAN PLASMA GLUCOSE: 200.12 mg/dL

## 2016-10-22 MED ORDER — BENZONATATE 100 MG PO CAPS
200.0000 mg | ORAL_CAPSULE | Freq: Three times a day (TID) | ORAL | Status: DC
  Administered 2016-10-22 – 2016-10-23 (×4): 200 mg via ORAL
  Filled 2016-10-22 (×4): qty 2

## 2016-10-22 MED ORDER — ENOXAPARIN SODIUM 40 MG/0.4ML ~~LOC~~ SOLN
40.0000 mg | SUBCUTANEOUS | Status: DC
  Administered 2016-10-22 – 2016-10-23 (×2): 40 mg via SUBCUTANEOUS
  Filled 2016-10-22 (×2): qty 0.4

## 2016-10-22 MED ORDER — IBUPROFEN 400 MG PO TABS
400.0000 mg | ORAL_TABLET | Freq: Once | ORAL | Status: AC
  Administered 2016-10-22: 400 mg via ORAL
  Filled 2016-10-22: qty 1

## 2016-10-22 MED ORDER — FUROSEMIDE 10 MG/ML IJ SOLN
40.0000 mg | Freq: Once | INTRAMUSCULAR | Status: AC
  Administered 2016-10-22: 40 mg via INTRAVENOUS
  Filled 2016-10-22: qty 4

## 2016-10-22 NOTE — Progress Notes (Signed)
[facility] - [place] at [facility]   PATIENT NAME: Danny Todd    MR#:  [id number]  DATE OF BIRTH:  February 21, 1955  SUBJECTIVE:  CHIEF COMPLAINT:   Chief Complaint  Patient presents with  . Fever  . Shortness of Breath   -Came in with fevers and cough. Exposed to people with upper respiratory infection at work last week. -Feels about the same  REVIEW OF SYSTEMS:  Review of Systems  Constitutional: Positive for fever and malaise/fatigue. Negative for chills.  HENT: Negative for congestion, ear discharge, hearing loss, nosebleeds and sinus pain.   Respiratory: Positive for cough. Negative for shortness of breath and wheezing.   Cardiovascular: Negative for chest pain, palpitations and leg swelling.  Gastrointestinal: Negative for abdominal pain, constipation, diarrhea, nausea and vomiting.  Genitourinary: Negative for dysuria.  Musculoskeletal: Positive for myalgias.  Neurological: Negative for dizziness, sensory change, speech change, focal weakness, seizures and headaches.  Psychiatric/Behavioral: Negative for depression.    DRUG ALLERGIES:  No Known Allergies  VITALS:  Blood pressure (!) 175/98, pulse 82, temperature 98.8 F (37.1 C), temperature source Oral, resp. rate 20, height  (1.88 m), weight 108.3 kg (238 lb 11.2 oz), SpO2 95 %.  PHYSICAL EXAMINATION:  Physical Exam  GENERAL:  61 y.o.-year-old patient lying in the bed with no acute distress.  EYES: Pupils equal, round, reactive to light and accommodation. No scleral icterus. Extraocular muscles intact.  HEENT: Head atraumatic, normocephalic. Oropharynx and nasopharynx clear.  NECK:  Supple, no jugular venous distention. No thyroid enlargement, no tenderness.  LUNGS: Normal breath sounds bilaterally, no wheezing, rales,rhonchi or crepitation. No use of accessory muscles of respiration. Decreased at the bases CARDIOVASCULAR: S1, S2 normal. No murmurs, rubs, or gallops.  ABDOMEN: Soft,  nontender, nondistended. Bowel sounds present. No organomegaly or mass.  EXTREMITIES: No pedal edema, cyanosis, or clubbing.  NEUROLOGIC: Cranial nerves II through XII are intact. Muscle strength 5/5 in all extremities. Sensation intact. Gait not checked.  PSYCHIATRIC: The patient is alert and oriented x 3.  SKIN: No obvious rash, lesion, or ulcer.    LABORATORY PANEL:   CBC  Recent Labs Lab 10/22/16 0330  WBC 3.1*  HGB 12.3*  HCT 37.7*  PLT 162   ------------------------------------------------------------------------------------------------------------------  Chemistries   Recent Labs Lab 10/21/16 2153  NA 134*  K 4.1  CL 102  CO2 25  GLUCOSE 153*  BUN 23*  CREATININE 1.85*  CALCIUM 7.7*  AST 107*  ALT 98*  ALKPHOS 59  BILITOT 0.4   ------------------------------------------------------------------------------------------------------------------  Cardiac Enzymes  Recent Labs Lab 10/21/16 1654  TROPONINI <0.03   ------------------------------------------------------------------------------------------------------------------  RADIOLOGY:  Dg Chest 2 View  Result Date: 10/21/2016 CLINICAL DATA:  Weakness, shortness of breath and fever. Not feeling well 2 days with cough and congestion. EXAM: CHEST  2 VIEW COMPARISON:  None. FINDINGS: Lungs are hypoinflated with mild bibasilar opacification which may be due to atelectasis or infection. No definite effusion. Minimal prominence of the perihilar markings. Mild cardiomegaly. Minimal degenerate change of the spine. IMPRESSION: Bibasilar opacification which may be due to atelectasis or infection. Mild cardiomegaly with suggestion of mild vascular congestion. Electronically Signed   By: Elberta Fortis M.D.   On: 10/21/2016 17:56   US Abdomen Limited Ruq  Result Date: 10/22/2016 CLINICAL DATA:  Abnormal LFTs. EXAM: ULTRASOUND ABDOMEN LIMITED RIGHT UPPER QUADRANT COMPARISON:  None. FINDINGS: Gallbladder: The gallbladder  was not visualized. The patient denies cholecystectomy. Common bile duct: Diameter: 5 mm Liver: Diffuse increased hepatic  echogenicity without focal mass. Portal vein is patent on color Doppler imaging with normal direction of blood flow towards the liver. IMPRESSION: 1. The gallbladder was not visualized. The patient denies previous cholecystectomy. There is suggestion of surgical clips to the right of L1 on a chest x-ray from yesterday. However, these are more medially located than typical for cholecystectomy clips. Recommend clinical correlation. If the patient does indeed still have his gallbladder, a HIDA scan could further evaluate. 2. Probable hepatic steatosis. Electronically Signed   By: Gerome Sam III M.D   On: 10/22/2016 09:10    EKG:   Orders placed or performed during the hospital encounter of 10/21/16  . ED EKG 12-Lead  . ED EKG 12-Lead    ASSESSMENT AND PLAN:   61 year old male with past medical history significant for hypertension and diabetes admitted for pneumonia  #1 pneumonia-bibasilar infiltrates on chest x-ray. Likely viral pneumonitis. -With high fevers, flu test was done which was negative. -Blood cultures are pending. Stop IV fluids and encourage to take oral fluids. -One dose of Lasix today. Cough medicines added. -Continue Rocephin and azithromycin.  #2 diabetes mellitus-Amaryl and metformin  on hold. Sliding scale insulin added.  #3 hypertension-hold lisinopril  #4 DVT prophylaxis-changed to Lovenox   All the records are reviewed and case discussed with Care Management/Social Workerr. Management plans discussed with the patient, family and they are in agreement.  CODE STATUS: full code  TOTAL TIME TAKING CARE OF THIS PATIENT: 37 minutes.   POSSIBLE D/C IN 1-2 DAYS, DEPENDING ON CLINICAL CONDITION.   Enid Baas M.D on 10/22/2016 at 10:17 AM  Between 7am to 6pm - Pager - [phone]  After 6pm go to [email or web address] - password [email or web address]  [facility] [place] [facility]  Office  [phone]  CC: Primary care physician; Patient, No Pcp Per

## 2016-10-23 LAB — URINE CULTURE: Culture: NO GROWTH

## 2016-10-23 LAB — BASIC METABOLIC PANEL
ANION GAP: 8 (ref 5–15)
BUN: 19 mg/dL (ref 6–20)
CHLORIDE: 101 mmol/L (ref 101–111)
CO2: 25 mmol/L (ref 22–32)
Calcium: 8.6 mg/dL — ABNORMAL LOW (ref 8.9–10.3)
Creatinine, Ser: 1.12 mg/dL (ref 0.61–1.24)
GFR calc non Af Amer: >60 mL/min (ref >60)
Glucose, Bld: 140 mg/dL — ABNORMAL HIGH (ref 65–99)
POTASSIUM: 4.2 mmol/L (ref 3.5–5.1)
Sodium: 134 mmol/L — ABNORMAL LOW (ref 135–145)

## 2016-10-23 LAB — GLUCOSE, CAPILLARY: Glucose-Capillary: 138 mg/dL — ABNORMAL HIGH (ref 65–99)

## 2016-10-23 MED ORDER — LEVOFLOXACIN 500 MG PO TABS: 500.0000 mg | ORAL_TABLET | Freq: Every day | ORAL | 0 refills | Status: AC

## 2016-10-23 MED ORDER — BENZONATATE 200 MG PO CAPS: 200.0000 mg | ORAL_CAPSULE | Freq: Three times a day (TID) | ORAL | 0 refills | Status: DC

## 2016-10-23 NOTE — Progress Notes (Signed)
Pt discharge home.

## 2016-10-23 NOTE — Discharge Summary (Signed)
[facility] [place] [facility]   PATIENT NAME: Danny Todd    MR#:  [id number]  DATE OF BIRTH:  [birth year]  DATE OF ADMISSION:  10/21/2016   ADMITTING PHYSICIAN: Shaune Pollack, MD  DATE OF DISCHARGE: 10/23/2016  PRIMARY CARE PHYSICIAN: Patient, No Pcp Per   ADMISSION DIAGNOSIS:   Acute kidney injury (HCC) [N17.9] Sepsis, due to unspecified organism (HCC) [A41.9] Community acquired pneumonia, unspecified laterality [J18.9]  DISCHARGE DIAGNOSIS:   Active Problems:   Sepsis (HCC)   SECONDARY DIAGNOSIS:   Past Medical History:  Diagnosis Date  . DM (diabetes mellitus) (HCC)   . Hyperlipidemia   . Hypertension     HOSPITAL COURSE:   61 year old male with past medical history significant for hypertension and diabetes admitted for pneumonia  #1 pneumonia-bibasilar infiltrates on chest x-ray. Likely started as viral pneumonitis. - flu test was done which was negative. Blood cultures are negative so far. -No nausea or vomiting. Afebrile since last evening. Feels fine and has been ambulating well this morning -Received Rocephin and azithromycin while in the hospital. Being discharged on Levaquin  #2 diabetes mellitus- A1c of 8.6. Continue Amaryl and metformin at discharge  #3 hypertension-hold lisinopril as came in with renal failure and also blood pressure has been low normal. -Advised to follow a strict low-sodium diet and outpatient follow-up  #4 acute renal failure on admission-likely secondary to underlying infection, ATN. -Improved with IV fluids.  DISCHARGE CONDITIONS:   Guarded CONSULTS OBTAINED:    none  DRUG ALLERGIES:   No Known Allergies DISCHARGE MEDICATIONS:   Allergies as of 10/23/2016   No Known Allergies     Medication List    STOP taking these medications   lisinopril 10 MG tablet Commonly known as:  PRINIVIL,ZESTRIL     TAKE these medications   aspirin EC 81 MG tablet Take 81-162 mg by mouth daily as needed  for moderate pain.   benzonatate 200 MG capsule Commonly known as:  TESSALON Take 1 capsule (200 mg total) by mouth 3 (three) times daily.   glimepiride 4 MG tablet Commonly known as:  AMARYL Take 4 mg by mouth daily.   levofloxacin 500 MG tablet Commonly known as:  LEVAQUIN Take 1 tablet (500 mg total) by mouth daily. X 7 days   metFORMIN 1000 MG tablet Commonly known as:  GLUCOPHAGE Take 1,000 mg by mouth 2 (two) times daily with a meal.   simvastatin 20 MG tablet Commonly known as:  ZOCOR Take 20 mg by mouth daily.            Discharge Care Instructions        Start     Ordered   10/23/16 0000  benzonatate (TESSALON) 200 MG capsule  3 times daily     10/23/16 0952   10/23/16 0000  levofloxacin (LEVAQUIN) 500 MG tablet  Daily     10/23/16 0952   10/23/16 0000  Diet - low sodium heart healthy     10/23/16 0952   10/23/16 0000  Activity as tolerated - No restrictions     10/23/16 0952       DISCHARGE INSTRUCTIONS:   1. PCP follow-up in 1-2 weeks  DIET:   Cardiac diet  ACTIVITY:   Activity as tolerated  OXYGEN:   Home Oxygen: No.  Oxygen Delivery: room air  DISCHARGE LOCATION:   home   If you experience worsening of your admission symptoms, develop shortness of breath, life threatening emergency, suicidal or homicidal  thoughts you must seek medical attention immediately by calling 911 or calling your MD immediately  if symptoms less severe.  You Must read complete instructions/literature along with all the possible adverse reactions/side effects for all the Medicines you take and that have been prescribed to you. Take any new Medicines after you have completely understood and accpet all the possible adverse reactions/side effects.   Please note  You were cared for by a hospitalist during your hospital stay. If you have any questions about your discharge medications or the care you received while you were in the hospital after you are discharged,  you can call the unit and asked to speak with the hospitalist on call if the hospitalist that took care of you is not available. Once you are discharged, your primary care physician will handle any further medical issues. Please note that NO REFILLS for any discharge medications will be authorized once you are discharged, as it is imperative that you return to your primary care physician (or establish a relationship with a primary care physician if you do not have one) for your aftercare needs so that they can reassess your need for medications and monitor your lab values.    On the day of Discharge:  VITAL SIGNS:   Blood pressure 111/65, pulse 81, temperature 99.5 F (37.5 C), temperature source Oral, resp. rate 18, height  (1.88 m), weight 108.3 kg (238 lb 11.2 oz), SpO2 92 %.  PHYSICAL EXAMINATION:    GENERAL:  61 y.o.-year-old patient lying in the bed with no acute distress.  EYES: Pupils equal, round, reactive to light and accommodation. No scleral icterus. Extraocular muscles intact.  HEENT: Head atraumatic, normocephalic. Oropharynx and nasopharynx clear.  NECK:  Supple, no jugular venous distention. No thyroid enlargement, no tenderness.  LUNGS: Normal breath sounds bilaterally, no wheezing, rales,rhonchi or crepitation. No use of accessory muscles of respiration. Decreased at the bases CARDIOVASCULAR: S1, S2 normal. No murmurs, rubs, or gallops.  ABDOMEN: Soft, nontender, nondistended. Bowel sounds present. No organomegaly or mass.  EXTREMITIES: No pedal edema, cyanosis, or clubbing.  NEUROLOGIC: Cranial nerves II through XII are intact. Muscle strength 5/5 in all extremities. Sensation intact. Gait not checked.  PSYCHIATRIC: The patient is alert and oriented x 3.  SKIN: No obvious rash, lesion, or ulcer.   DATA REVIEW:   CBC  Recent Labs Lab 10/22/16 0330  WBC 3.1*  HGB 12.3*  HCT 37.7*  PLT 162    Chemistries   Recent Labs Lab 10/21/16 2153 10/23/16 0340    NA 134* 134*  K 4.1 4.2  CL 102 101  CO2 25 25  GLUCOSE 153* 140*  BUN 23* 19  CREATININE 1.85* 1.12  CALCIUM 7.7* 8.6*  AST 107*  --   ALT 98*  --   ALKPHOS 59  --   BILITOT 0.4  --      Microbiology Results  Results for orders placed or performed during the hospital encounter of 10/21/16  Blood Culture (routine x 2)     Status: None (Preliminary result)   Collection Time: 10/21/16  6:12 PM  Result Value Ref Range Status   Specimen Description BLOOD RIGHT ANTECUBITAL  Final   Special Requests   Final    BOTTLES DRAWN AEROBIC AND ANAEROBIC Blood Culture adequate volume   Culture NO GROWTH < 12 HOURS  Final   Report Status PENDING  Incomplete  Blood Culture (routine x 2)     Status: None (Preliminary result)   Collection  Time: 10/21/16  6:12 PM  Result Value Ref Range Status   Specimen Description BLOOD LEFT ANTECUBITAL  Final   Special Requests   Final    BOTTLES DRAWN AEROBIC AND ANAEROBIC Blood Culture adequate volume   Culture NO GROWTH < 12 HOURS  Final   Report Status PENDING  Incomplete  Urine culture     Status: None   Collection Time: 10/21/16  9:35 PM  Result Value Ref Range Status   Specimen Description URINE, RANDOM  Final   Special Requests NONE  Final   Culture   Final    NO GROWTH Performed at [facility], [address] [place], [place] [address]    Report Status 10/23/2016 FINAL  Final    RADIOLOGY:  No results found.   Management plans discussed with the patient, family and they are in agreement.  CODE STATUS:     Code Status Orders        Start     Ordered   10/21/16 2034  Full code  Continuous     10/21/16 2033    Code Status History    Date Active Date Inactive Code Status Order ID Comments User Context   This patient has a current code status but no historical code status.      TOTAL TIME TAKING CARE OF THIS PATIENT: 37 minutes.    Enid Baas M.D on 10/23/2016 at 9:53 AM  Between 7am to 6pm - Pager -  [phone]  After 6pm go to [email or web address] - Social research officer, government  [facility] [place] [facility]  Office  [phone]  CC: Primary care physician; Patient, No Pcp Per   Note: This dictation was prepared with Dragon dictation along with smaller phrase technology. Any transcriptional errors that result from this process are unintentional.

## 2016-10-23 NOTE — Progress Notes (Signed)
     Danny Todd was admitted to the [facility] on 10/21/2016 for an acute medical condition and is being Discharged on  10/23/2016 . He will need another 2 days for recovery and so advised to stay away from work until then. So please excuse him  from work for the above  Days. Should be able to return to work  without any restrictions from 10/26/16.  Call Enid Baas  MD, [facility] at  [phone] with questions.  Enid Baas M.D on 10/23/2016,at 9:52 AM  [facility] [address], [place] [address]

## 2016-10-24 LAB — HIV ANTIBODY (ROUTINE TESTING W REFLEX): HIV Screen 4th Generation wRfx: NONREACTIVE

## 2016-10-26 LAB — CULTURE, BLOOD (ROUTINE X 2)
CULTURE: NO GROWTH
Culture: NO GROWTH
Special Requests: ADEQUATE
Special Requests: ADEQUATE

## 2017-10-06 ENCOUNTER — Emergency Department
Admission: EM | Admit: 2017-10-06 | Discharge: 2017-10-06 | Disposition: A | Discharge status: Home / Self Care | Payer: Managed Care, Other (non HMO) | Attending: Emergency Medicine | Admitting: Emergency Medicine

## 2017-10-06 ENCOUNTER — Other Ambulatory Visit: Payer: Self-pay

## 2017-10-06 DIAGNOSIS — R739 Hyperglycemia, unspecified: Secondary | ICD-10-CM | POA: Diagnosis present

## 2017-10-06 DIAGNOSIS — I10 Essential (primary) hypertension: Secondary | ICD-10-CM | POA: Diagnosis not present

## 2017-10-06 DIAGNOSIS — E1165 Type 2 diabetes mellitus with hyperglycemia: Secondary | ICD-10-CM | POA: Insufficient documentation

## 2017-10-06 DIAGNOSIS — Z7984 Long term (current) use of oral hypoglycemic drugs: Secondary | ICD-10-CM | POA: Insufficient documentation

## 2017-10-06 LAB — URINALYSIS, COMPLETE (UACMP) WITH MICROSCOPIC
Bacteria, UA: NONE SEEN
Bilirubin Urine: NEGATIVE
Glucose, UA: >=500 mg/dL — AB
HGB URINE DIPSTICK: NEGATIVE
Ketones, ur: 5 mg/dL — AB
LEUKOCYTES UA: NEGATIVE
Nitrite: NEGATIVE
PROTEIN: NEGATIVE mg/dL
Specific Gravity, Urine: 1.029 (ref 1.005–1.030)
Squamous Epithelial / LPF: NONE SEEN (ref 0–5)
pH: 6 (ref 5.0–8.0)

## 2017-10-06 LAB — BASIC METABOLIC PANEL
ANION GAP: 7 (ref 5–15)
BUN: 12 mg/dL (ref 8–23)
CALCIUM: 8.9 mg/dL (ref 8.9–10.3)
CO2: 28 mmol/L (ref 22–32)
Chloride: 98 mmol/L (ref 98–111)
Creatinine, Ser: 1.03 mg/dL (ref 0.61–1.24)
GFR calc non Af Amer: >60 mL/min (ref >60)
GLUCOSE: 444 mg/dL — AB (ref 70–99)
Potassium: 4.4 mmol/L (ref 3.5–5.1)
SODIUM: 133 mmol/L — AB (ref 135–145)

## 2017-10-06 LAB — CBC
HCT: 36.3 % — ABNORMAL LOW (ref 40.0–52.0)
Hemoglobin: 12.7 g/dL — ABNORMAL LOW (ref 13.0–18.0)
MCH: 25 pg — ABNORMAL LOW (ref 26.0–34.0)
MCHC: 35.1 g/dL (ref 32.0–36.0)
MCV: 71.1 fL — AB (ref 80.0–100.0)
PLATELETS: 819 10*3/uL — AB (ref 150–440)
RBC: 5.11 MIL/uL (ref 4.40–5.90)
RDW: 14.4 % (ref 11.5–14.5)
WBC: 7 10*3/uL (ref 3.8–10.6)

## 2017-10-06 LAB — GLUCOSE, CAPILLARY
Glucose-Capillary: 416 mg/dL — ABNORMAL HIGH (ref 70–99)
Glucose-Capillary: 241 mg/dL — ABNORMAL HIGH (ref 70–99)

## 2017-10-06 IMAGING — US US ABDOMEN LIMITED
1 series · 14 of 25 positions shown · non-contrast
Comparison: None.

CLINICAL DATA: Abnormal LFTs.

EXAM:
ULTRASOUND ABDOMEN LIMITED RIGHT UPPER QUADRANT

[Series 1: us abdomen limited · 0.25mm/px · 14 of 45 slices shown]
[im 1/45]
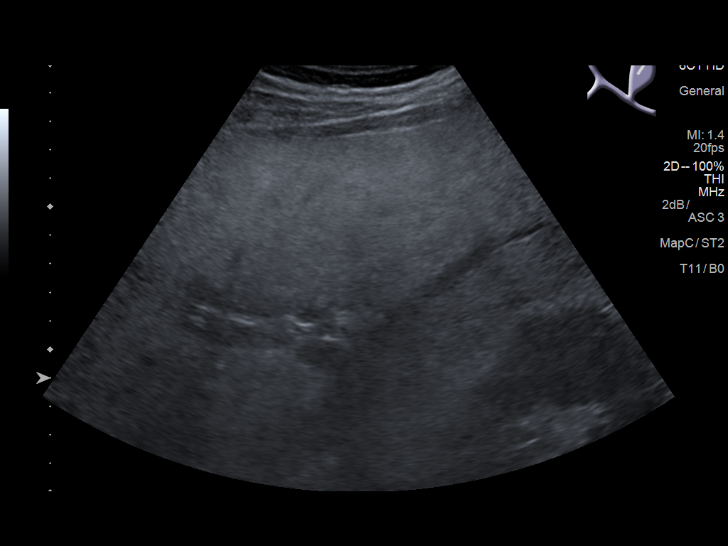
[im 4/45]
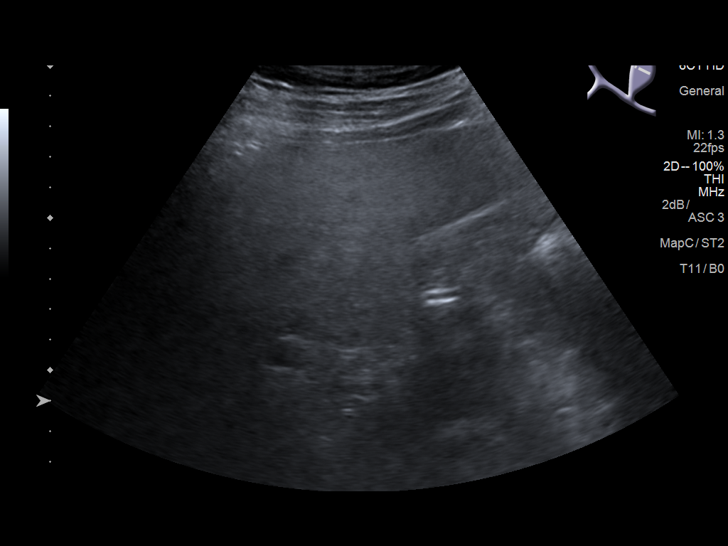
[im 8/45]
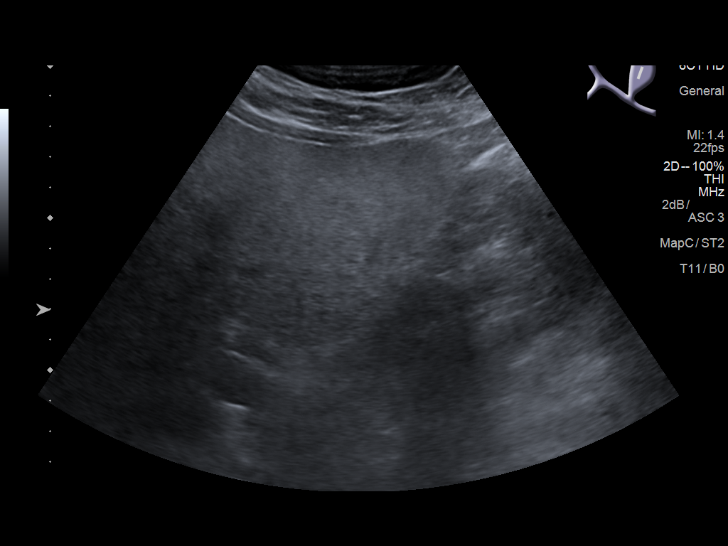
[im 12/45]
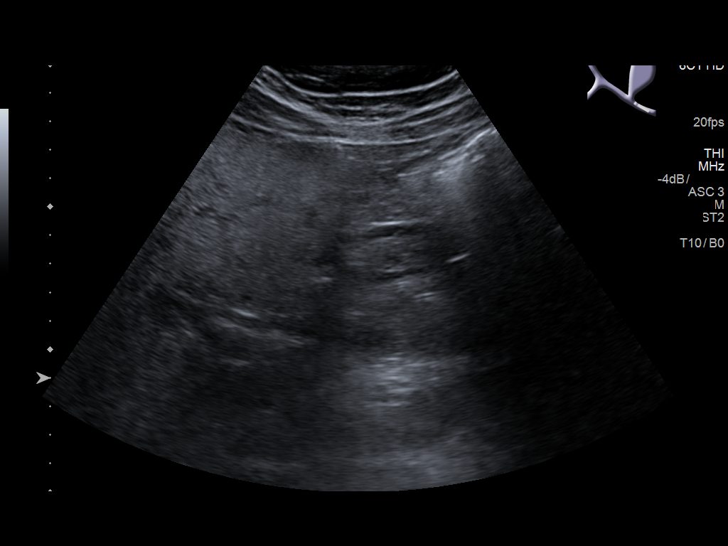
[im 15/45]
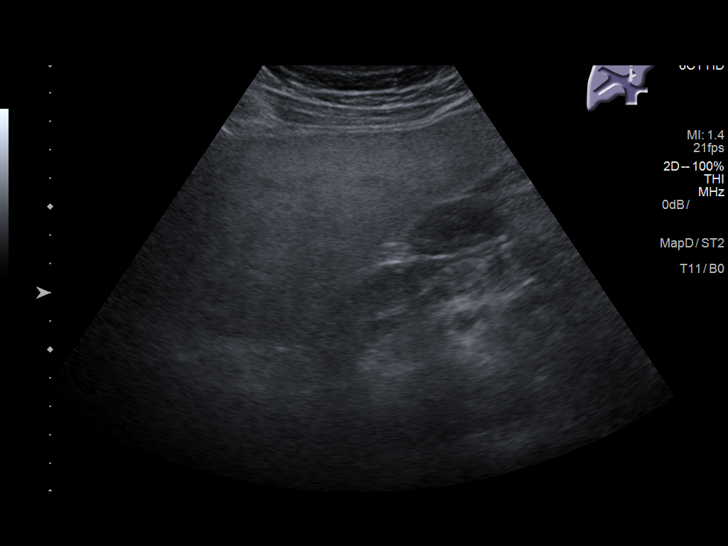
[im 17/45]
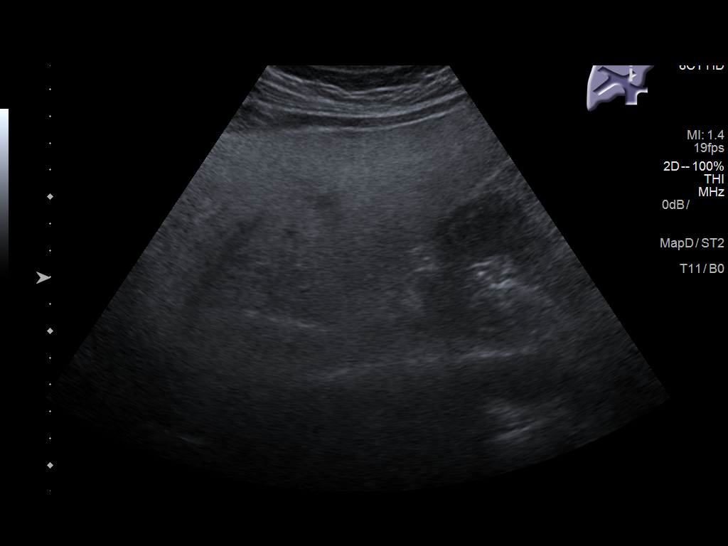
[im 21/45]
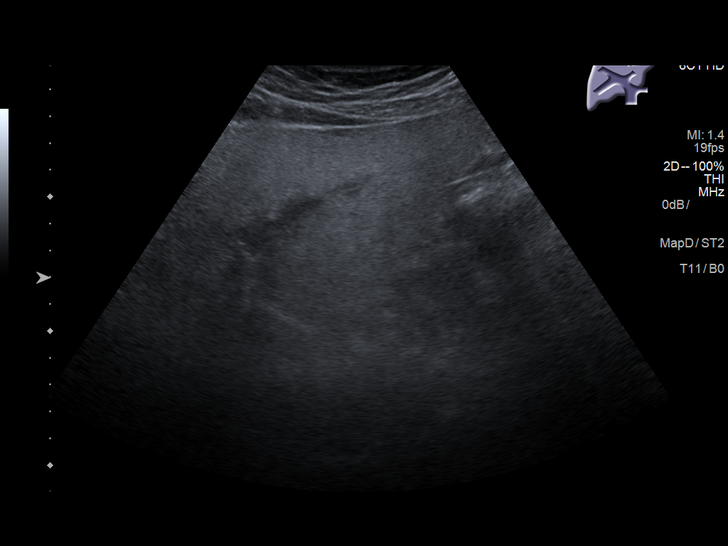
[im 24/45]
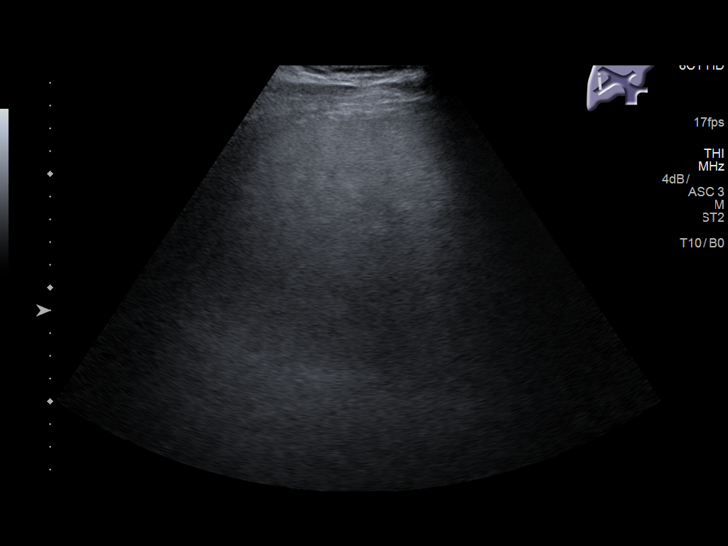
[im 28/45]
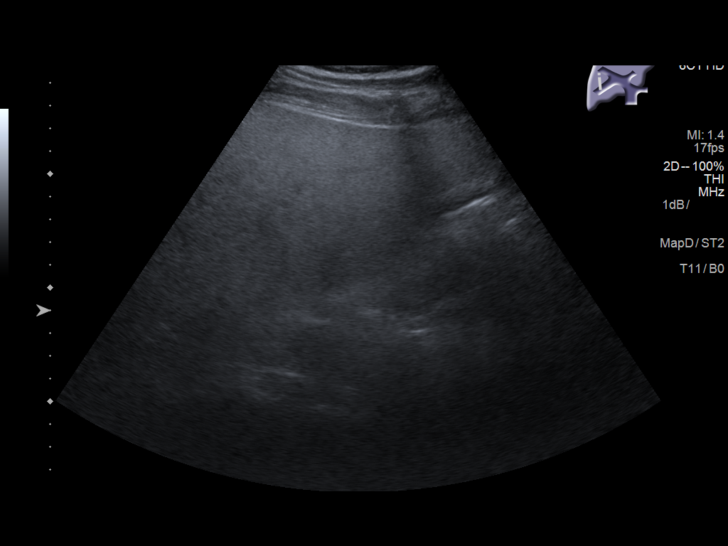
[im 30/45]
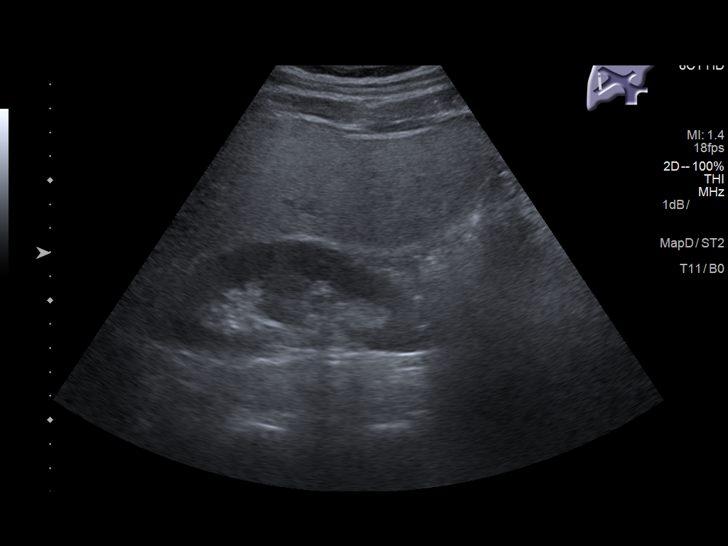
[im 34/45]
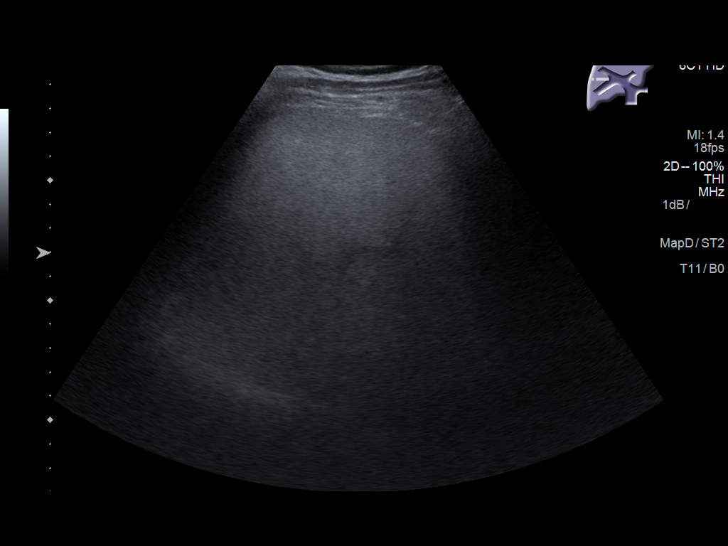
[im 37/45]
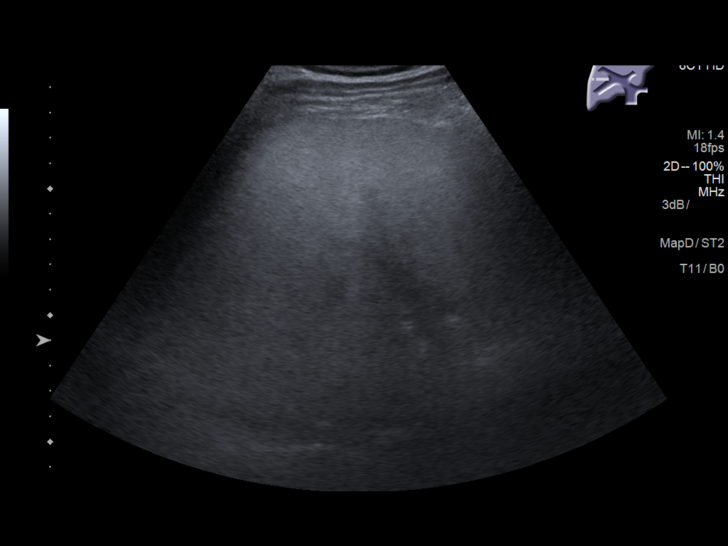
[im 41/45]
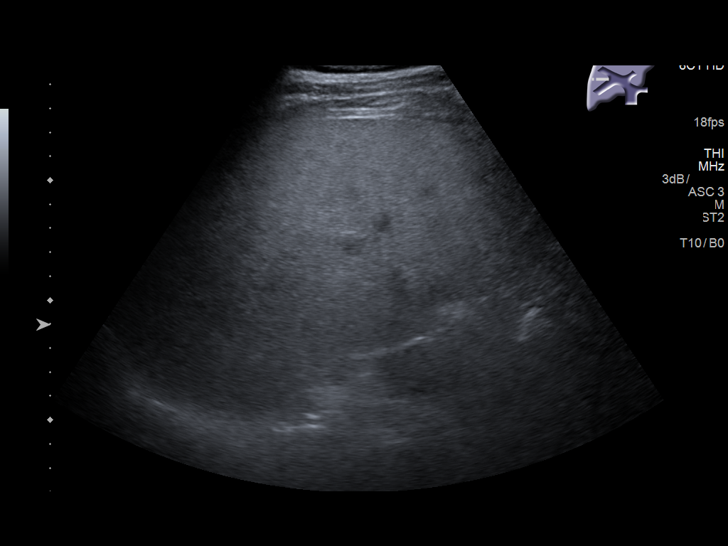
[im 45/45]
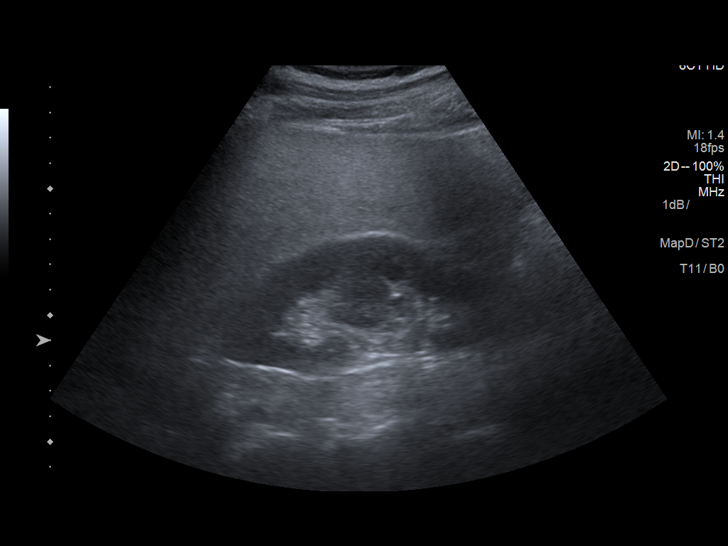

[14 of 25 positions shown; findings below may reference images not displayed]

FINDINGS: Gallbladder:

The gallbladder was not visualized. The patient denies
cholecystectomy.

Common bile duct:

Diameter: 5 mm

Liver:

Diffuse increased hepatic echogenicity without focal mass. Portal
vein is patent on color Doppler imaging with normal direction of
blood flow towards the liver.
IMPRESSION: 1. The gallbladder was not visualized. The patient denies previous
cholecystectomy. There is suggestion of surgical clips to the right
of L1 on a chest x-ray from yesterday. However, these are more
medially located than typical for cholecystectomy clips. Recommend
clinical correlation. If the patient does indeed still have his
gallbladder, a HIDA scan could further evaluate.
2. Probable hepatic steatosis.

## 2017-10-06 MED ORDER — SODIUM CHLORIDE 0.9 % IV BOLUS
1000.0000 mL | Freq: Once | INTRAVENOUS | Status: AC
  Administered 2017-10-06: 1000 mL via INTRAVENOUS

## 2017-10-06 MED ORDER — GLUCOSE BLOOD VI STRP: ORAL_STRIP | 0 refills | Status: DC

## 2017-10-06 MED ORDER — INSULIN ASPART 100 UNIT/ML ~~LOC~~ SOLN
10.0000 [IU] | Freq: Once | SUBCUTANEOUS | Status: AC
  Administered 2017-10-06: 10 [IU] via INTRAVENOUS
  Filled 2017-10-06: qty 1

## 2017-10-06 NOTE — Discharge Instructions (Addendum)
Stop taking nonprescription vitamins, and take only prescribed medications.  Check your blood pressure and your blood sugar once daily and recorded; bring the paper with you to your doctor's appointment.  Return to the emergency department if you develop severe pain, lightheadedness or fainting, numbness tingling or weakness, chest pain, nausea or vomiting, fever, or any other symptoms concerning to you.

## 2017-10-06 NOTE — ED Triage Notes (Signed)
Pt went to Drs office this AM and then to Urgent Care where he was informed that his blood pressure and sugar where elevated. Pt NAD at present. CBG here 416.

## 2017-10-06 NOTE — ED Provider Notes (Signed)
[facility] Emergency Department Provider Note  ____________________________________________  Time seen: Approximately 8:33 PM  I have reviewed the triage vital signs and the nursing notes.   HISTORY  Chief Complaint Hypertension    HPI Danny Todd is a 62 y.o. male history of HTN, HL, and DM presenting emergent care for hyperglycemia and hypertension.  The patient reports that he recently has started taking some "vitamins".  He states he has continued to take his metformin but has not taken his blood pressure medicine as he was out of it.  He did pick up a prescription today.  Patient denies any symptoms including nausea vomiting or diarrhea, fever, urinary symptoms, chest pain, shortness of breath, numbness tingling or weakness.  Overall, the patient is completely asymptomatic.  Past Medical History:  Diagnosis Date  . DM (diabetes mellitus) (HCC)   . Hyperlipidemia   . Hypertension     Patient Active Problem List   Diagnosis Date Noted  . Sepsis (HCC) 10/21/2016    History reviewed. No pertinent surgical history.  Current Outpatient Rx  . Order #: [id number] Class: Historical Med  . Order #: [id number] Class: Normal  . Order #: [id number] Class: Historical Med  . Order #: [id number] Class: Normal  . Order #: [id number] Class: Historical Med  . Order #: [id number] Class: Historical Med    Allergies Patient has no known allergies.  Family History  Problem Relation Age of Onset  . Diabetes Mother     Social History Social History   Tobacco Use  . Smoking status: Never Smoker  . Smokeless tobacco: Never Used  Substance Use Topics  . Alcohol use: No  . Drug use: No    Review of Systems Constitutional: No fever/chills.  No lightheadedness or syncope. Eyes: No visual changes. ENT: No sore throat. No congestion or rhinorrhea. Cardiovascular: Denies chest pain. Denies palpitations.  Positive hypertension.  Respiratory: Denies shortness of  breath.  No cough. Gastrointestinal: No abdominal pain.  No nausea, no vomiting.  No diarrhea.  No constipation. Genitourinary: Negative for dysuria. Musculoskeletal: Negative for back pain. Skin: Negative for rash. Neurological: Negative for headaches. No focal numbness, tingling or weakness.  Endocrine:Positive hyperglycemia.  ____________________________________________   PHYSICAL EXAM:  VITAL SIGNS: ED Triage Vitals [10/06/17 1850]  Enc Vitals Group     BP (!) 159/90     Pulse Rate 87     Resp 18     Temp 98.4 F (36.9 C)     Temp src      SpO2 93 %     Weight 230 lb (104.3 kg)     Height 5\' 9"  (1.753 m)     Head Circumference      Peak Flow      Pain Score 0     Pain Loc      Pain Edu?      Excl. in GC?     Constitutional: Alert and oriented. Answers questions appropriately. Eyes: Conjunctivae are ejected bilaterally.  EOMI. No scleral icterus. Head: Atraumatic. Nose: No congestion/rhinnorhea. Mouth/Throat: Mucous membranes are moist.  Neck: No stridor.  Supple.  No JVD.  No meningismus. Cardiovascular: Normal rate, regular rhythm. No murmurs, rubs or gallops.  Respiratory: Normal respiratory effort.  No accessory muscle use or retractions. Lungs CTAB.  No wheezes, rales or ronchi. Gastrointestinal: Obese.  Soft, nontender and nondistended.  No guarding or rebound.  No peritoneal signs. Musculoskeletal: No LE edema. No ttp in the calves or palpable cords.  Negative Homan's sign. Neurologic:  A&Ox3.  Speech is clear.  Face and smile are symmetric.  EOMI.  Moves all extremities well. Skin:  Skin is warm, dry and intact. No rash noted. Psychiatric: Mood and affect are normal. Speech and behavior are normal.  Normal judgement.  ____________________________________________   LABS (all labs ordered are listed, but only abnormal results are displayed)  Labs Reviewed  CBC - Abnormal; Notable for the following components:      Result Value   Hemoglobin 12.7 (*)     HCT 36.3 (*)    MCV 71.1 (*)    MCH 25.0 (*)    Platelets 819 (*)    All other components within normal limits  BASIC METABOLIC PANEL - Abnormal; Notable for the following components:   Sodium 133 (*)    Glucose, Bld 444 (*)    All other components within normal limits  URINALYSIS, COMPLETE (UACMP) WITH MICROSCOPIC - Abnormal; Notable for the following components:   Color, Urine STRAW (*)    APPearance CLEAR (*)    Glucose, UA >=500 (*)    Ketones, ur 5 (*)    All other components within normal limits  GLUCOSE, CAPILLARY - Abnormal; Notable for the following components:   Glucose-Capillary 416 (*)    All other components within normal limits  CBG MONITORING, ED   ____________________________________________  EKG  ED ECG REPORT I, Anne-Caroline Sharma Covert, the attending physician, personally viewed and interpreted this ECG.   Date: 10/06/2017  EKG Time: 2042  Rate: 76  Rhythm: normal sinus rhythm  Axis: normal  Intervals:none  ST&T Change: No STEMI  ____________________________________________  RADIOLOGY  No results found.  ____________________________________________   PROCEDURES  Procedure(s) performed: None  Procedures  Critical Care performed: No ____________________________________________   INITIAL IMPRESSION / ASSESSMENT AND PLAN / ED COURSE  Pertinent labs & imaging results that were available during my care of the patient were reviewed by me and considered in my medical decision making (see chart for details).  62 y.o. L with history of hypertension and diabetes, noncompliance with medications, brought from urgent care for hypertension and hyperglycemia.  Overall, here, the patient's blood pressure is 159/90 and on my repeat examination it is 149/78 after he has taken his prescription antihypertensive that he picked up today.  He is unable to tell me which medication it is, but it does appear that his blood pressure has improved.  He has no signs or  symptoms of emergency complication from his hypertension.  He does have a blood sugar of 444 here, but no evidence of DKA; anion gap is 7.  The patient has no signs or symptoms that are concerning for another cause for his hyperglycemia; this is likely due to his chronic diabetes.  At this time, the patient is safe for discharge home.  I have had a long discussion with him and his wife about compliance with medications, and how to track his blood sugars and blood pressures at home, and bring the record with him to his primary care physician's office.  We discussed follow-up instructions as well as return precautions.  The patient is safe for discharge at this time peer  ____________________________________________  FINAL CLINICAL IMPRESSION(S) / ED DIAGNOSES  Final diagnoses:  Essential hypertension  Hyperglycemia         NEW MEDICATIONS STARTED DURING THIS VISIT:  New Prescriptions   GLUCOSE BLOOD TEST STRIP    Use as instructed      Rockne Menghini, MD 10/06/17 2048

## 2017-10-06 NOTE — ED Notes (Signed)
Fluids continue to infuse. Family at bedside. Pt states he is hungry

## 2019-06-07 DIAGNOSIS — I10 Essential (primary) hypertension: Secondary | ICD-10-CM | POA: Insufficient documentation

## 2023-05-18 ENCOUNTER — Other Ambulatory Visit (HOSPITAL_COMMUNITY): Payer: Self-pay

## 2023-05-18 ENCOUNTER — Encounter: Payer: Self-pay | Admitting: Student in an Organized Health Care Education/Training Program

## 2023-05-18 ENCOUNTER — Ambulatory Visit: Admitting: Student in an Organized Health Care Education/Training Program

## 2023-05-18 ENCOUNTER — Telehealth: Payer: Self-pay

## 2023-05-18 ENCOUNTER — Other Ambulatory Visit (INDEPENDENT_AMBULATORY_CARE_PROVIDER_SITE_OTHER)

## 2023-05-18 VITALS — BP 160/110 | HR 88 | Wt 218.0 lb

## 2023-05-18 DIAGNOSIS — M1712 Unilateral primary osteoarthritis, left knee: Secondary | ICD-10-CM | POA: Diagnosis not present

## 2023-05-18 DIAGNOSIS — Z114 Encounter for screening for human immunodeficiency virus [HIV]: Secondary | ICD-10-CM | POA: Diagnosis not present

## 2023-05-18 DIAGNOSIS — E119 Type 2 diabetes mellitus without complications: Secondary | ICD-10-CM | POA: Insufficient documentation

## 2023-05-18 DIAGNOSIS — Z1159 Encounter for screening for other viral diseases: Secondary | ICD-10-CM

## 2023-05-18 DIAGNOSIS — I10 Essential (primary) hypertension: Secondary | ICD-10-CM | POA: Diagnosis not present

## 2023-05-18 DIAGNOSIS — E1165 Type 2 diabetes mellitus with hyperglycemia: Secondary | ICD-10-CM | POA: Diagnosis not present

## 2023-05-18 DIAGNOSIS — Z7984 Long term (current) use of oral hypoglycemic drugs: Secondary | ICD-10-CM

## 2023-05-18 DIAGNOSIS — E1140 Type 2 diabetes mellitus with diabetic neuropathy, unspecified: Secondary | ICD-10-CM | POA: Insufficient documentation

## 2023-05-18 DIAGNOSIS — Z7985 Long-term (current) use of injectable non-insulin antidiabetic drugs: Secondary | ICD-10-CM

## 2023-05-18 LAB — LIPID PANEL
Cholesterol: 212 mg/dL — ABNORMAL HIGH (ref 0–200)
HDL: 37 mg/dL — ABNORMAL LOW (ref >39.00)
LDL Cholesterol: 148 mg/dL — ABNORMAL HIGH (ref 0–99)
NonHDL: 175.39
Total CHOL/HDL Ratio: 6
Triglycerides: 137 mg/dL (ref 0.0–149.0)
VLDL: 27.4 mg/dL (ref 0.0–40.0)

## 2023-05-18 LAB — BASIC METABOLIC PANEL WITH GFR
BUN: 12 mg/dL (ref 6–23)
CO2: 30 meq/L (ref 19–32)
Calcium: 9.1 mg/dL (ref 8.4–10.5)
Chloride: 96 meq/L (ref 96–112)
Creatinine, Ser: 0.98 mg/dL (ref 0.40–1.50)
GFR: 78.98 mL/min (ref >60.00)
Glucose, Bld: 297 mg/dL — ABNORMAL HIGH (ref 70–99)
Potassium: 4.2 meq/L (ref 3.5–5.1)
Sodium: 133 meq/L — ABNORMAL LOW (ref 135–145)

## 2023-05-18 LAB — GLUCOSE, POCT (MANUAL RESULT ENTRY): POC Glucose: 302 mg/dL — AB (ref 70–99)

## 2023-05-18 LAB — POCT GLYCOSYLATED HEMOGLOBIN (HGB A1C): Hemoglobin A1C: 13.3 % — AB (ref 4.0–5.6)

## 2023-05-18 MED ORDER — OZEMPIC (0.25 OR 0.5 MG/DOSE) 2 MG/3ML ~~LOC~~ SOPN: 0.2500 mg | PEN_INJECTOR | SUBCUTANEOUS | 1 refills | Status: DC

## 2023-05-18 MED ORDER — AMLODIPINE BESYLATE-VALSARTAN 5-160 MG PO TABS: 1.0000 | ORAL_TABLET | Freq: Every day | ORAL | 1 refills | Status: DC

## 2023-05-18 MED ORDER — EMPAGLIFLOZIN 10 MG PO TABS: 10.0000 mg | ORAL_TABLET | Freq: Every day | ORAL | 1 refills | Status: DC

## 2023-05-18 MED ORDER — MELOXICAM 7.5 MG PO TABS: 7.5000 mg | ORAL_TABLET | Freq: Every day | ORAL | 0 refills | Status: DC

## 2023-05-18 NOTE — Assessment & Plan Note (Signed)
 Chronic and uncontrolled.  A1c today is 13%.  We set a goal of better control in the future, starting with a new set of medications.  He was intolerant of metformin due to side effects.  He is now willing to use injection medications.  Plan will be to start Jardiance and Ozempic.  Follow-up with me in 1 month to see how he is tolerating.

## 2023-05-18 NOTE — Assessment & Plan Note (Signed)
 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  He does seem to have small effusion in the joint today.  We talked about medical management including anti-inflammatories like meloxicam.  We talked about safe use of NSAIDs.  Could offer steroid injection in the future, but I recommended we get better control of his diabetes first.

## 2023-05-18 NOTE — Telephone Encounter (Signed)
 Pharmacy Patient Advocate Encounter   Received notification from CoverMyMeds that prior authorization for Ozempic (0.25 or 0.5 MG/DOSE) 2MG /3ML pen-injectors is required/requested.   Insurance verification completed.   The patient is insured through [facility] .   Per test claim: PA required; PA submitted to above mentioned insurance via CoverMyMeds Key/confirmation #/EOC (Key: [id number])   Status is pending

## 2023-05-18 NOTE — Progress Notes (Signed)
 New Patient Office Visit  Subjective    Patient ID: Danny Todd, male    DOB: 1955/08/30  Age: 68 y.o. MRN: [id number]  CC:   Chief Complaint  Patient presents with   Knee Pain    Patient [place] to work in cold tempet[place], Knee pain that has been going on for quite some time  Medication refills and discuss medications.  Spot on palm of right hand    HPI  Danny Todd presents to establish care  68 year old person here for management of diabetes and hypertension.  It has been a little over a year since he has seen a physician.  He had side effects to previous medications, discontinued them.  Says he takes metformin now every once in a while but it causes him to pee a lot.  Denies any concerns today, other than having some discomfort in his left knee.  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  He is married and lives with his wife and [place].  He reports being independent and functional in all activities.  He reports being motivated to get back on track and start taking medications again for his diabetes and high blood pressure.  He denies ever having a heart attack, stroke, or serious illness.  Denies ever being hospitalized, denies having any surgeries.  Does not use tobacco, does not drink alcohol.  Denies any history of DKA.   Outpatient Encounter Medications as of 05/18/2023  Medication Sig   amLODipine-valsartan (EXFORGE) 5-160 MG tablet Take 1 tablet by mouth daily.   empagliflozin (JARDIANCE) 10 MG TABS tablet Take 1 tablet (10 mg total) by mouth daily before breakfast.   meloxicam (MOBIC) 7.5 MG tablet Take 1 tablet (7.5 mg total) by mouth daily.   Semaglutide,0.25 or 0.5MG /DOS, (OZEMPIC, 0.25 OR 0.5 MG/DOSE,) 2 MG/3ML SOPN Inject 0.25 mg into the skin once a week.   [DISCONTINUED] aspirin EC 81 MG tablet Take 81-162 mg by mouth daily as needed for moderate pain.   [DISCONTINUED] glucose blood test strip Use as instructed   [DISCONTINUED] metFORMIN (GLUCOPHAGE) 1000  MG tablet Take 1,000 mg by mouth 2 (two) times daily with a meal.   [DISCONTINUED] simvastatin (ZOCOR) 20 MG tablet Take 20 mg by mouth daily.   [DISCONTINUED] benzonatate (TESSALON) 200 MG capsule Take 1 capsule (200 mg total) by mouth 3 (three) times daily. (Patient not taking: Reported on 05/18/2023)   [DISCONTINUED] glimepiride (AMARYL) 4 MG tablet Take 4 mg by mouth daily. (Patient not taking: Reported on 05/18/2023)   No facility-administered encounter medications on file as of 05/18/2023.    Past Medical History:  Diagnosis Date   DM (diabetes mellitus) (HCC)    Hyperlipidemia    Hypertension     No past surgical history on file.  Family History  Problem Relation Age of Onset   Diabetes Mother     Social History   Socioeconomic History   Marital status: Married    Spouse name: Not on file   Number of children: Not on file   Years of education: Not on file   Highest education level: Not on file  Occupational History   Not on file  Tobacco Use   Smoking status: Never   Smokeless tobacco: Never  Substance and Sexual Activity   Alcohol use: No   Drug use: No   Sexual activity: Not on file  Other Topics Concern   Not on file  Social History Narrative   Not on file  Social Drivers of Corporate investment banker Strain: Not on file  Food Insecurity: Not on file  Transportation Needs: Not on file  Physical Activity: Not on file  Stress: Not on file  Social Connections: Not on file  Intimate Partner Violence: Not on file        Objective    BP (!) 160/110   Pulse 88   Wt 218 lb (98.9 kg)   SpO2 99%   BMI 32.19 kg/m   Physical Exam  Gen: Well-appearing man Eyes: Bilateral moderate cataracts Mouth: Dentures in place Neck: Normal thyroid, no nodules or adenopathy Heart: Regular, no murmur Lungs: Unlabored, clear throughout Abd: Soft, no organomegaly Ext: warm, no edema, left knee with a small effusion, Anterior crepitus on the left side Neuro:  Alert, conversational, full strength upper and lower extremities, normal gait, normal get up and go Psych: Appropriate mood and affect, not depressed or anxious appearing      Assessment & Plan:   Problem List Items Addressed This Visit       High   Hypertension, essential (Chronic)   Chronic and uncontrolled.  Not currently on medications.  Denies any side effects to antihypertensive medications.  I reviewed the last renal function panel in care everywhere from 2023 which was normal.  Will start amlodipine and valsartan combination tablet today.  Follow-up in 1 month.      Relevant Medications   amLODipine-valsartan (EXFORGE) 5-160 MG tablet   Other Relevant Orders   Basic metabolic panel with GFR   Lipid panel   Diabetes mellitus without complication (HCC) - Primary (Chronic)   Chronic and uncontrolled.  A1c today is 13%.  We set a goal of better control in the future, starting with a new set of medications.  He was intolerant of metformin due to side effects.  He is now willing to use injection medications.  Plan will be to start Jardiance and Ozempic.  Follow-up with me in 1 month to see how he is tolerating.      Relevant Medications   empagliflozin (JARDIANCE) 10 MG TABS tablet   Semaglutide,0.25 or 0.5MG /DOS, (OZEMPIC, 0.25 OR 0.5 MG/DOSE,) 2 MG/3ML SOPN   amLODipine-valsartan (EXFORGE) 5-160 MG tablet   Other Relevant Orders   Basic metabolic panel with GFR   Lipid panel     Medium    Arthritis of left knee (Chronic)   Exam today of the left knee is consistent with a mild-moderate osteoarthritis, seems to be mostly patellofemoral.  He does seem to have small effusion in the joint today.  We talked about medical management including anti-inflammatories like meloxicam.  We talked about safe use of NSAIDs.  Could offer steroid injection in the future, but I recommended we get better control of his diabetes first.      Relevant Medications   meloxicam (MOBIC) 7.5 MG tablet    Other Visit Diagnoses       Uncontrolled type 2 diabetes mellitus with hyperglycemia (HCC)       Relevant Medications   empagliflozin (JARDIANCE) 10 MG TABS tablet   Semaglutide,0.25 or 0.5MG /DOS, (OZEMPIC, 0.25 OR 0.5 MG/DOSE,) 2 MG/3ML SOPN   amLODipine-valsartan (EXFORGE) 5-160 MG tablet   Other Relevant Orders   POCT glycosylated hemoglobin (Hb A1C) (Completed)   POCT glucose (manual entry) (Completed)     Screening for HIV (human immunodeficiency virus)       Relevant Orders   HIV Antibody (routine testing w rflx)     Encounter for HCV  screening test for low risk patient       Relevant Orders   Hepatitis C antibody       No follow-ups on file.   Ether Hercules, MD

## 2023-05-18 NOTE — Assessment & Plan Note (Signed)
 Chronic and uncontrolled.  Not currently on medications.  Denies any side effects to antihypertensive medications.  I reviewed the last renal function panel in care everywhere from 2023 which was normal.  Will start amlodipine and valsartan combination tablet today.  Follow-up in 1 month.

## 2023-05-19 ENCOUNTER — Other Ambulatory Visit (HOSPITAL_COMMUNITY): Payer: Self-pay

## 2023-05-19 NOTE — Telephone Encounter (Signed)
 Pharmacy Patient Advocate Encounter  Received notification from [employer] that Prior Authorization for Ozempic  (0.25 or 0.5 MG/DOSE) 2MG /3ML pen-injectors has been APPROVED from 4.17.25 to 4.17.26. Ran test claim, Copay is $RTS, RX WAS LAST FILLED 4.17.25. This test claim was processed through [facility]- copay amounts may vary at other pharmacies due to pharmacy/plan contracts, or as the patient moves through the different stages of their insurance plan.   PA #/Case ID/Reference #: (Key: [id number])

## 2023-05-20 LAB — HEPATITIS C ANTIBODY: Hepatitis C Ab: NONREACTIVE

## 2023-05-20 LAB — HIV ANTIBODY (ROUTINE TESTING W REFLEX): HIV 1&2 Ab, 4th Generation: NONREACTIVE

## 2023-05-22 ENCOUNTER — Encounter: Payer: Self-pay | Admitting: Student in an Organized Health Care Education/Training Program

## 2023-05-23 ENCOUNTER — Ambulatory Visit: Admitting: Podiatry

## 2023-06-08 ENCOUNTER — Encounter (HOSPITAL_COMMUNITY): Payer: Self-pay

## 2023-06-09 ENCOUNTER — Encounter (HOSPITAL_COMMUNITY): Payer: Self-pay

## 2023-06-15 ENCOUNTER — Ambulatory Visit: Admitting: Student in an Organized Health Care Education/Training Program

## 2023-06-16 ENCOUNTER — Encounter: Payer: Self-pay | Admitting: Student in an Organized Health Care Education/Training Program

## 2023-06-16 ENCOUNTER — Ambulatory Visit: Admitting: Student in an Organized Health Care Education/Training Program

## 2023-06-16 ENCOUNTER — Ambulatory Visit: Payer: Self-pay | Admitting: Student in an Organized Health Care Education/Training Program

## 2023-06-16 VITALS — BP 163/94 | HR 93 | Wt 216.0 lb

## 2023-06-16 DIAGNOSIS — E119 Type 2 diabetes mellitus without complications: Secondary | ICD-10-CM

## 2023-06-16 DIAGNOSIS — E1142 Type 2 diabetes mellitus with diabetic polyneuropathy: Secondary | ICD-10-CM

## 2023-06-16 DIAGNOSIS — I10 Essential (primary) hypertension: Secondary | ICD-10-CM

## 2023-06-16 DIAGNOSIS — Z7985 Long-term (current) use of injectable non-insulin antidiabetic drugs: Secondary | ICD-10-CM

## 2023-06-16 LAB — BASIC METABOLIC PANEL WITH GFR
BUN: 13 mg/dL (ref 6–23)
CO2: 28 meq/L (ref 19–32)
Calcium: 9.5 mg/dL (ref 8.4–10.5)
Chloride: 101 meq/L (ref 96–112)
Creatinine, Ser: 1.1 mg/dL (ref 0.40–1.50)
GFR: 68.72 mL/min (ref >60.00)
Glucose, Bld: 170 mg/dL — ABNORMAL HIGH (ref 70–99)
Potassium: 4.2 meq/L (ref 3.5–5.1)
Sodium: 137 meq/L (ref 135–145)

## 2023-06-16 MED ORDER — GABAPENTIN 100 MG PO CAPS: 100.0000 mg | ORAL_CAPSULE | Freq: Every day | ORAL | 3 refills | Status: DC

## 2023-06-16 MED ORDER — AMLODIPINE BESYLATE-VALSARTAN 5-160 MG PO TABS: 2.0000 | ORAL_TABLET | Freq: Every day | ORAL | Status: DC

## 2023-06-16 MED ORDER — OZEMPIC (0.25 OR 0.5 MG/DOSE) 2 MG/3ML ~~LOC~~ SOPN: 0.5000 mg | PEN_INJECTOR | SUBCUTANEOUS | 1 refills | Status: DC

## 2023-06-16 NOTE — Assessment & Plan Note (Signed)
 Chronic issue of leg pain.  Initially we thought it would be an arthritis, we tried an NSAID but did not get much improvement.  Today he is describing the discomfort is more of a neuropathic type pain below the knee.  He also has paresthesias below both feet.  He has high risk for diabetic neuropathy.  We decided to stop the NSAID, we will start gabapentin.  We talked about risks and benefits.  We talked about reasonable expectations.  Will start with 100 mg nightly.  If tolerates it well, and needs an increased dose, can move up to 200 mg nightly after about a week.

## 2023-06-16 NOTE — Assessment & Plan Note (Signed)
 Chronic and uncontrolled.  Tolerated starting Ozempic  0.25 g last week.  Has had about 4 doses.  Will increase to 0.5 mg.  Continue with Jardiance  10 mg daily.  Follow-up in 2 months for recheck of the A1c.

## 2023-06-16 NOTE — Progress Notes (Signed)
 Established Patient Office Visit  Subjective   Patient ID: Danny Todd, male    DOB: 1955/04/14  Age: 68 y.o. MRN: [id number]  Chief Complaint  Patient presents with   Medical Management of Chronic Issues    4 week follow up for diabetes management , Still having pain in left leg.   Colonoscopy-never and declines Eye Exam- Never     HPI  68 year old person here for for management of diabetes and hypertension.  I met him 4 weeks ago, uncontrolled.  He started Jardiance  and semaglutide . For his hypertension we started amlodipine  valsartan .  He reports good adherence with his medications.  Says he takes his pills every day.  Very confident in this.  Asked him more about the medications and he said his wife handles all the pills.  Denies any adverse side effects.  No nausea or vomiting.  No constipation since starting Ozempic .  Doing the injections well without problem.  He reports persistent discomfort in his bilateral legs.  Feels a toothache type discomfort in the left leg below the knee.  Mostly bothers him at night.  Denies any claudication type symptoms.  He also has paresthesias at the bottom of both feet.    Objective:     BP (!) 163/94   Pulse 93   Wt 216 lb (98 kg)   SpO2 98%   BMI 31.90 kg/m    Physical Exam  Gen: Well-appearing Heart: Regular, no murmur Lungs: Unlabored, clear throughout Abd: Soft, nontender Ext: Warm, no edema, no ulcerations, varicose veins on the left    Assessment & Plan:   Problem List Items Addressed This Visit       High   Hypertension, essential (Chronic)   Chronic and uncontrolled.  Blood pressure still elevated today.  We started combination amlodipine  and valsartan  4 weeks ago.  He reports good adherence with this medication, says that he took it today.  I do not see much improvement in blood pressure which does not make much sense to me.  He is very confident in his adherence to the new medicine.  As such I recommended  increasing the dose to 2 tablets daily.  Call me if he has any lightheadedness.  Check BMP today.  Follow-up in 2 months.      Relevant Medications   amLODipine -valsartan  (EXFORGE ) 5-160 MG tablet   Other Relevant Orders   Basic metabolic panel with GFR   Diabetes mellitus without complication (HCC) - Primary (Chronic)   Chronic and uncontrolled.  Tolerated starting Ozempic  0.25 g last week.  Has had about 4 doses.  Will increase to 0.5 mg.  Continue with Jardiance  10 mg daily.  Follow-up in 2 months for recheck of the A1c.      Relevant Medications   Semaglutide ,0.25 or 0.5MG /DOS, (OZEMPIC , 0.25 OR 0.5 MG/DOSE,) 2 MG/3ML SOPN   amLODipine -valsartan  (EXFORGE ) 5-160 MG tablet     Medium    Diabetic neuropathy (HCC) (Chronic)   Chronic issue of leg pain.  Initially we thought it would be an arthritis, we tried an NSAID but did not get much improvement.  Today he is describing the discomfort is more of a neuropathic type pain below the knee.  He also has paresthesias below both feet.  He has high risk for diabetic neuropathy.  We decided to stop the NSAID, we will start gabapentin.  We talked about risks and benefits.  We talked about reasonable expectations.  Will start with 100 mg nightly.  If tolerates  it well, and needs an increased dose, can move up to 200 mg nightly after about a week.      Relevant Medications   Semaglutide ,0.25 or 0.5MG /DOS, (OZEMPIC , 0.25 OR 0.5 MG/DOSE,) 2 MG/3ML SOPN   gabapentin (NEURONTIN) 100 MG capsule   amLODipine -valsartan  (EXFORGE ) 5-160 MG tablet    Return in about 2 months (around 08/16/2023) for diabetes management.    Ether Hercules, MD

## 2023-06-16 NOTE — Assessment & Plan Note (Addendum)
 Chronic and uncontrolled.  Blood pressure still elevated today.  We started combination amlodipine  and valsartan  4 weeks ago.  He reports good adherence with this medication, says that he took it today.  I do not see much improvement in blood pressure which does not make much sense to me.  He is very confident in his adherence to the new medicine.  As such I recommended increasing the dose to 2 tablets daily.  Call me if he has any lightheadedness.  Check BMP today.  Follow-up in 2 months.

## 2023-08-18 ENCOUNTER — Ambulatory Visit: Admitting: Student in an Organized Health Care Education/Training Program

## 2023-08-24 ENCOUNTER — Encounter: Payer: Self-pay | Admitting: Student in an Organized Health Care Education/Training Program

## 2023-08-24 ENCOUNTER — Telehealth: Payer: Self-pay

## 2023-08-24 ENCOUNTER — Ambulatory Visit: Admitting: Student in an Organized Health Care Education/Training Program

## 2023-08-24 VITALS — BP 187/100 | HR 97 | Wt 213.0 lb

## 2023-08-24 DIAGNOSIS — E1142 Type 2 diabetes mellitus with diabetic polyneuropathy: Secondary | ICD-10-CM | POA: Diagnosis not present

## 2023-08-24 DIAGNOSIS — Z7985 Long-term (current) use of injectable non-insulin antidiabetic drugs: Secondary | ICD-10-CM | POA: Diagnosis not present

## 2023-08-24 DIAGNOSIS — E119 Type 2 diabetes mellitus without complications: Secondary | ICD-10-CM

## 2023-08-24 DIAGNOSIS — Z23 Encounter for immunization: Secondary | ICD-10-CM

## 2023-08-24 DIAGNOSIS — I10 Essential (primary) hypertension: Secondary | ICD-10-CM

## 2023-08-24 LAB — POCT GLYCOSYLATED HEMOGLOBIN (HGB A1C): HbA1c, POC (controlled diabetic range): 9.3 % — AB (ref 0.0–7.0)

## 2023-08-24 MED ORDER — SEMAGLUTIDE (1 MG/DOSE) 4 MG/3ML ~~LOC~~ SOPN: 1.0000 mg | PEN_INJECTOR | SUBCUTANEOUS | 2 refills | Status: DC

## 2023-08-24 MED ORDER — GABAPENTIN 300 MG PO CAPS: 300.0000 mg | ORAL_CAPSULE | Freq: Three times a day (TID) | ORAL | 3 refills | Status: AC

## 2023-08-24 NOTE — Assessment & Plan Note (Signed)
 Nocturnal leg pain persists, and the current gabapentin  dose is insufficient. Increasing the dose is expected to improve pain management. Increase gabapentin  to 300 mg capsule once daily. Advise monitoring legs and feet for wounds or injuries and report them.

## 2023-08-24 NOTE — Progress Notes (Signed)
 Established Patient Office Visit  Subjective   Patient ID: Danny Todd, male    DOB: 1955/04/10  Age: 68 y.o. MRN: [id number]  Chief Complaint  Patient presents with   Medical Management of Chronic Issues    2 month follow up     HPI  Discussed the use of AI scribe software for clinical note transcription with the patient, who gave verbal consent to proceed.  History of Present Illness Danny Todd is a 68 year old male with diabetes who presents with leg pain and a request for employment leave documentation.  He experiences intermittent aching pain in his legs, described as feeling like a 'toothache', primarily bothersome at night. The pain does not prevent him from working. He uses gabapentin  100 mg in the evening for neuropathy, taking two tablets a day, but finds it only sometimes effective.  He has a history of diabetes for about a year, managed with Ozempic , a once-weekly injection, and Jardiance . He reports no nausea from Ozempic  and last took it on Wednesday. He is not checking his blood sugars at home and denies any episodes of hypoglycemia. He has lost about five pounds since April.  He is on medication for hypertension, which he forgot to take this morning. He was previously instructed to take two pills a day but is unsure of the dosage. He takes his medication every morning before work.  He mentions a scar on his right leg, possibly from an injury at work.  He is seeking documentation for potential employment leave, as suggested by his supervisor, although he has not had to take time off work yet.  No chest pain, shortness of breath, or hypoglycemia. Reports leg pain primarily at night.     Objective:     BP (!) 187/100   Pulse 97   Wt 213 lb (96.6 kg)   BMI 31.45 kg/m    Physical Exam  Gen: Well-appearing man Neck: Normal thyroid, no nodules or adenopathy Heart: Regular, no murmur Lungs: Unlabored, clear throughout Ext: Warm, no edema, there is a  long scratch on the right side of his leg but is healing well with no signs of infection   Results for orders placed or performed in visit on 08/24/23  POCT glycosylated hemoglobin (Hb A1C)  Result Value Ref Range   Hemoglobin A1C     HbA1c POC (<> result, manual entry)     HbA1c, POC (prediabetic range)     HbA1c, POC (controlled diabetic range) 9.3 (A) 0.0 - 7.0 %      Assessment & Plan:   Problem List Items Addressed This Visit       High   Hypertension, essential (Chronic)   Chronic and uncontrolled.  Managed with Amlodipine -valsartan . Missed dose today.  Has been using only 1 tablet daily instead of 2 tablets that we discussed at last visit.  Emphasized adherence for effective control. Reinforce the importance of taking Amlodipine -valsartan  5-160 mg 2 tablets daily.      Diabetes mellitus without complication (HCC) - Primary (Chronic)   Chronic and improving.  A1c at 9.2% today, much improved from 11.6% 3 months ago. Managed with Jardiance  and Semaglutide .  Some mild weight loss over the last 3 months.  Insulin  initiation discussed if control remains suboptimal. Increasing Ozempic  dosage is expected to enhance control. Increase Semaglutide  (Ozempic ) to 1 mg weekly and continue Jardiance . Discuss potential insulin  therapy if glycemic control is not achieved in three months.  Goal A1c is less than 8%.  Relevant Medications   Semaglutide , 1 MG/DOSE, 4 MG/3ML SOPN   Other Relevant Orders   Microalbumin / creatinine urine ratio   POCT glycosylated hemoglobin (Hb A1C) (Completed)     Medium    Diabetic neuropathy (HCC) (Chronic)   Nocturnal leg pain persists, and the current gabapentin  dose is insufficient. Increasing the dose is expected to improve pain management. Increase gabapentin  to 300 mg capsule once daily. Advise monitoring legs and feet for wounds or injuries and report them.      Relevant Medications   gabapentin  (NEURONTIN ) 300 MG capsule   Semaglutide , 1  MG/DOSE, 4 MG/3ML SOPN   Other Visit Diagnoses       Need for pneumococcal 20-valent conjugate vaccination       Relevant Orders   Pneumococcal conjugate vaccine 20-valent (Prevnar 20) (Completed)     Need for shingles vaccine       Relevant Orders   Varicella-zoster vaccine IM (Completed)       Return in about 3 months (around 11/24/2023).    Cleatus Debby Specking, MD

## 2023-08-24 NOTE — Patient Instructions (Signed)
  VISIT SUMMARY: [place], during your visit, we discussed your leg pain, diabetes management, and hypertension. You also requested documentation for potential employment leave. We reviewed your current medications and made some adjustments to better manage your symptoms and overall health.  YOUR PLAN: -DIABETES MELLITUS TYPE 2: Type 2 diabetes is a condition where your body does not use insulin  properly, leading to high blood sugar levels. We will increase your Ozempic  dosage to 1 mg weekly and continue Jardiance . If your blood sugar levels are not well controlled in three months, we may consider starting insulin  therapy.  -DIABETIC NEUROPATHY: Diabetic neuropathy is nerve damage caused by high blood sugar levels, leading to pain and numbness, often in the legs. We will increase your gabapentin  dose to 300 mg once daily to help manage your leg pain. Please monitor your legs and feet for any wounds or injuries and report them to us .  -HYPERTENSION: Hypertension, or high blood pressure, can lead to serious health issues if not managed properly. It is important to take your Amlodipine -valsartan  5-160 mg twice daily as prescribed to keep your blood pressure under control.  -GENERAL HEALTH MAINTENANCE: We discussed the shingles vaccine, which is available at the pharmacy. Please schedule an appointment at the pharmacy to receive this vaccine.  INSTRUCTIONS: Please follow up in three months to review your blood sugar levels and discuss the potential need for insulin  therapy. Continue monitoring your legs and feet for any signs of injury. Ensure you take your hypertension medication as prescribed. Schedule an appointment at the pharmacy for your shingles vaccine.

## 2023-08-24 NOTE — Progress Notes (Signed)
 Obtained documents for FMLA . Made copies and placed in scanned bin up front to be scanned to patients chart.

## 2023-08-24 NOTE — Telephone Encounter (Signed)
 Patient has diabetic neuropathy bothersome to him at night.  He has tried gabapentin  100 mg nightly, increased to 200 mg nightly for last 2 weeks.  No benefit so next step is to increase to 300 mg nightly.

## 2023-08-24 NOTE — Telephone Encounter (Signed)
 Pharmacist called asking for rational for jump from 100 mg to 300 mg of Gabapentin  in order for it to be filled.  Please advise and clinical can return call to the pharmacy

## 2023-08-24 NOTE — Assessment & Plan Note (Signed)
 Chronic and improving.  A1c at 9.2% today, much improved from 11.6% 3 months ago. Managed with Jardiance  and Semaglutide .  Some mild weight loss over the last 3 months.  Insulin  initiation discussed if control remains suboptimal. Increasing Ozempic  dosage is expected to enhance control. Increase Semaglutide  (Ozempic ) to 1 mg weekly and continue Jardiance . Discuss potential insulin  therapy if glycemic control is not achieved in three months.  Goal A1c is less than 8%.

## 2023-08-24 NOTE — Assessment & Plan Note (Signed)
 Chronic and uncontrolled.  Managed with Amlodipine -valsartan . Missed dose today.  Has been using only 1 tablet daily instead of 2 tablets that we discussed at last visit.  Emphasized adherence for effective control. Reinforce the importance of taking Amlodipine -valsartan  5-160 mg 2 tablets daily.

## 2023-08-24 NOTE — Telephone Encounter (Signed)
 Copied from CRM [id number]. Topic: Clinical - Medication Question >> Aug 24, 2023 11:06 AM Lavanda D wrote: Reason for CRM: Layne with [facility], was on 100mg  1x a night - jump to gabapentin  (NEURONTIN ) 300 MG capsule. She said this is a big jump for gabapenting and she needs the reason for documentation purposes.

## 2023-08-24 NOTE — Telephone Encounter (Signed)
 Called the pharmacist back and provided rational

## 2023-08-25 LAB — MICROALBUMIN / CREATININE URINE RATIO
Creatinine,U: 53.8 mg/dL
Microalb Creat Ratio: 67.9 mg/g — ABNORMAL HIGH (ref 0.0–30.0)
Microalb, Ur: 3.6 mg/dL — ABNORMAL HIGH (ref 0.0–1.9)

## 2023-10-23 ENCOUNTER — Other Ambulatory Visit: Payer: Self-pay | Admitting: Student in an Organized Health Care Education/Training Program

## 2023-10-23 DIAGNOSIS — I10 Essential (primary) hypertension: Secondary | ICD-10-CM

## 2023-11-01 ENCOUNTER — Emergency Department (HOSPITAL_BASED_OUTPATIENT_CLINIC_OR_DEPARTMENT_OTHER)
Admission: EM | Admit: 2023-11-01 | Discharge: 2023-11-01 | Disposition: A | Discharge status: Home / Self Care | Attending: Emergency Medicine | Admitting: Emergency Medicine

## 2023-11-01 ENCOUNTER — Encounter (HOSPITAL_BASED_OUTPATIENT_CLINIC_OR_DEPARTMENT_OTHER): Payer: Self-pay | Admitting: Emergency Medicine

## 2023-11-01 ENCOUNTER — Emergency Department (HOSPITAL_BASED_OUTPATIENT_CLINIC_OR_DEPARTMENT_OTHER)

## 2023-11-01 DIAGNOSIS — M79605 Pain in left leg: Secondary | ICD-10-CM | POA: Insufficient documentation

## 2023-11-01 DIAGNOSIS — I10 Essential (primary) hypertension: Secondary | ICD-10-CM | POA: Diagnosis not present

## 2023-11-01 DIAGNOSIS — M79604 Pain in right leg: Secondary | ICD-10-CM | POA: Diagnosis present

## 2023-11-01 DIAGNOSIS — Z79899 Other long term (current) drug therapy: Secondary | ICD-10-CM | POA: Diagnosis not present

## 2023-11-01 DIAGNOSIS — E119 Type 2 diabetes mellitus without complications: Secondary | ICD-10-CM | POA: Diagnosis not present

## 2023-11-01 NOTE — ED Provider Notes (Signed)
 [place] EMERGENCY DEPARTMENT AT [facility] Provider Note   CSN: [id number] Arrival date & time: 11/01/23  1139     Patient presents with: Leg Pain   Danny Todd is a 68 y.o. male with history of type 2 diabetes, hypertension, presents with concern for bilateral leg pain ongoing for the past 4 months.  Reports the pain in his right leg is concentrated mostly in the right thigh and right calf.  Pain in the left leg is mostly in the left calf.  Denies any swelling in his legs. He denies any injuries to his legs.  Denies any numbness or tingling.  Denies any weakness in the legs.  He has been started on gabapentin  by his PCP without improvement in symptoms. He was referred for a nerve study due to his diabetic neuropathy, but has not had this completed yet.     Leg Pain      Prior to Admission medications   Medication Sig Start Date End Date Taking? Authorizing Provider  amLODipine -valsartan  (EXFORGE ) 5-160 MG tablet TAKE ONE TABLET BY MOUTH ONE TIME DAILY 10/23/23   Jerrell Cleatus Ned, MD  empagliflozin  (JARDIANCE ) 10 MG TABS tablet Take 1 tablet (10 mg total) by mouth daily before breakfast. 05/18/23   Jerrell Cleatus Ned, MD  gabapentin  (NEURONTIN ) 300 MG capsule Take 1 capsule (300 mg total) by mouth 3 (three) times daily. 08/24/23   Jerrell Cleatus Ned, MD  Semaglutide , 1 MG/DOSE, 4 MG/3ML SOPN Inject 1 mg as directed once a week. 08/24/23   Jerrell Cleatus Ned, MD    Allergies: Patient has no known allergies.    Review of Systems  Musculoskeletal:        Leg pain    Updated Vital Signs BP (!) 157/97 (BP Location: Right Arm)   Pulse 99   Temp 97.9 F (36.6 C)   Resp 16   SpO2 98%   Physical Exam Vitals and nursing note reviewed.  Constitutional:      Appearance: Normal appearance.  HENT:     Head: Atraumatic.  Cardiovascular:     Comments: 2+ pedal pulses bilaterally Pulmonary:     Effort: Pulmonary effort is normal.  Musculoskeletal:      Comments: Bilateral lower extremities  General Dry skin of the bilateral lower extremities. No obvious deformity. No erythema, edema, contusions, open wounds   Palpation Non tender over the femur, tibia and fibula, patella, MCL, LCL Nontender on the lateral and medial malleolus Non-tender of the popliteal fossa Nontender over the musculature of the thighs and calves bilaterally  ROM Full hip flexion and extension Full knee flexion and extension Full ankle plantarflexion and dorsiflexion Able to ambulate without difficulty  Sensation: Reports baseline neuropathy under the right foot in the arch area. Otherwise, intact sensation to bilateral lower extremities  Strength: 5/5 strength with resisted knee flexion and extension  5/5 strength with resisted ankle plantarflexion and dorsiflexion   Neurological:     General: No focal deficit present.     Mental Status: He is alert.  Psychiatric:        Mood and Affect: Mood normal.        Behavior: Behavior normal.     (all labs ordered are listed, but only abnormal results are displayed) Labs Reviewed - No data to display  EKG: None  Radiology: US  Venous Img Lower Bilateral Result Date: 11/01/2023 CLINICAL DATA:  Right thigh and calf pain left calf pain for approximately 3 months. EXAM: Bilateral LOWER EXTREMITY VENOUS  DOPPLER ULTRASOUND TECHNIQUE: Gray-scale sonography with compression, as well as color and duplex ultrasound, were performed to evaluate the deep venous system(s) from the level of the common femoral vein through the popliteal and proximal calf veins. COMPARISON:  None Available. FINDINGS: VENOUS Normal compressibility of the common femoral, superficial femoral, and popliteal veins, as well as the visualized calf veins. Visualized portions of profunda femoral vein and great saphenous vein unremarkable. No filling defects to suggest DVT on grayscale or color Doppler imaging. Doppler waveforms show normal direction of  venous flow, normal respiratory plasticity and response to augmentation. Limited views of the contralateral common femoral vein are unremarkable. OTHER None. Limitations: none IMPRESSION: No DVT bilateral lower extremity. Electronically Signed   By: Cordella Banner   On: 11/01/2023 13:17     Procedures   Medications Ordered in the ED - No data to display                                  Medical Decision Making    Differential diagnosis includes but is not limited to venous insufficiency, DVT, cellulitis, muscle strain, radiculopathy, fracture, dislocation  ED Course:  Upon initial evaluation, patient is very well-appearing, no acute distress.  Stable vitals aside from mildly elevated blood pressure 157/97.  He is having bilateral leg pain for the past 4 months.  Does not have any known injury, no bony tenderness to palpation, full range of motion of the bilateral lower extremities, no concern for fracture or dislocation at this time.  He does not have any edema of the lower extremities and denies any chest pain, shortness of breath, low concern for CHF.  He has baseline diabetic neuropathy in the right foot, but otherwise intact sensation to the bilateral lower extremities.  Denies any numbness or tingling sensation, and has 5/5 strength throughout the bilateral lower extremities, no back pain, lower concern for a neuropathy causing his pain.  No overlying wounds or erythema to suggest infectious etiology.  Will proceed with DVT study for further evaluation.   Imaging Studies ordered: I ordered imaging studies including ultrasound bilateral lower extremities I independently visualized the imaging with scope of interpretation limited to determining acute life threatening conditions related to emergency care. Imaging showed no evidence of DVT in the bilateral lower extremities I agree with the radiologist interpretation   Medications Given: None  Upon re-evaluation, patient remains  well-appearing.  Ultrasound of the left and right lower extremity did not show any signs of a DVT.  Able to ambulate without difficulty. No signs of infection.  Vascularly intact in the bilateral lower extremities and baseline neurological exam of the bilateral lower extremities.  I have low concern for emergent pathology that would require any further workup at this time in the emergency room.  Feel he is stable and appropriate for discharge home with outpatient follow-up.    Impression: Bilateral leg pain  Disposition:  The patient was discharged home with instructions to follow-up with his PCP for further workup and management of his leg pain.  He has an appointment with neurology on 12/14/2023 for a nerve study, encouraged him to to this appointment for further evaluation. Return precautions given.    Record Review: External records from outside source obtained and reviewed including orthopedic visit  on 10/12/2023 for his bilateral lower extremity pain     This chart was dictated using voice recognition software, Dragon. Despite the best efforts of  this provider to proofread and correct errors, errors may still occur which can change documentation meaning.       Final diagnoses:  Bilateral leg pain    ED Discharge Orders     None          Veta Palma, [place] 11/01/23 1346    Freddi Hamilton, MD 11/01/23 1433

## 2023-11-01 NOTE — Discharge Instructions (Addendum)
 The ultrasound of your left and right leg today did not show any blood clots.  Please attend your neurology appointment on 12/14/2023 for your nerve study for further evaluation of your leg pain.  Please follow-up with your PCP sooner as needed.   Please return to the ER for uncontrolled pain, color change in your legs, any complete numbness in your legs, severe back pain, any other new or concerning symptoms.

## 2023-11-01 NOTE — ED Triage Notes (Signed)
 C/o bilateral leg pain x 3 months. Denies injury.

## 2023-11-23 ENCOUNTER — Ambulatory Visit: Admitting: Student in an Organized Health Care Education/Training Program

## 2023-11-23 ENCOUNTER — Encounter: Payer: Self-pay | Admitting: Student in an Organized Health Care Education/Training Program

## 2023-11-23 VITALS — BP 133/89 | HR 114 | Wt 203.0 lb

## 2023-11-23 DIAGNOSIS — M79605 Pain in left leg: Secondary | ICD-10-CM

## 2023-11-23 DIAGNOSIS — Z7984 Long term (current) use of oral hypoglycemic drugs: Secondary | ICD-10-CM

## 2023-11-23 DIAGNOSIS — E1169 Type 2 diabetes mellitus with other specified complication: Secondary | ICD-10-CM | POA: Diagnosis not present

## 2023-11-23 DIAGNOSIS — M79604 Pain in right leg: Secondary | ICD-10-CM

## 2023-11-23 DIAGNOSIS — I10 Essential (primary) hypertension: Secondary | ICD-10-CM | POA: Diagnosis not present

## 2023-11-23 DIAGNOSIS — E1142 Type 2 diabetes mellitus with diabetic polyneuropathy: Secondary | ICD-10-CM | POA: Diagnosis not present

## 2023-11-23 DIAGNOSIS — E119 Type 2 diabetes mellitus without complications: Secondary | ICD-10-CM

## 2023-11-23 DIAGNOSIS — E785 Hyperlipidemia, unspecified: Secondary | ICD-10-CM

## 2023-11-23 DIAGNOSIS — E1140 Type 2 diabetes mellitus with diabetic neuropathy, unspecified: Secondary | ICD-10-CM

## 2023-11-23 LAB — POCT GLYCOSYLATED HEMOGLOBIN (HGB A1C): Hemoglobin A1C: 6.9 % — AB (ref 4.0–5.6)

## 2023-11-23 MED ORDER — ROSUVASTATIN CALCIUM 20 MG PO TABS: 20.0000 mg | ORAL_TABLET | Freq: Every day | ORAL | 3 refills | Status: AC

## 2023-11-23 MED ORDER — AMLODIPINE BESYLATE-VALSARTAN 10-320 MG PO TABS: 1.0000 | ORAL_TABLET | Freq: Every day | ORAL | 3 refills | Status: AC

## 2023-11-23 MED ORDER — EMPAGLIFLOZIN 10 MG PO TABS: 10.0000 mg | ORAL_TABLET | Freq: Every day | ORAL | 1 refills | Status: AC

## 2023-11-23 NOTE — Assessment & Plan Note (Signed)
 Chronic and much improved.  A1c is down to 6.9%.  This is a 2 point drop over the last 3 months, great to see.  He has complications of diabetes including neuropathy.  Will continue with semaglutide  1 mg weekly and Jardiance  10 mg daily.

## 2023-11-23 NOTE — Assessment & Plan Note (Signed)
 Chronic issue.  Discomfort in his legs seems consistent with diabetic neuropathy.  He has been using gabapentin  300 mg once daily.  I recommended increasing it to twice daily for 2 weeks.  If he needs more control, that can increase up to 3 times daily after that.

## 2023-11-23 NOTE — Patient Instructions (Addendum)
  VISIT SUMMARY: Today, we discussed your leg pain and reviewed your diabetes and blood pressure management. We also talked about your current medications and made some adjustments to help improve your symptoms.  YOUR PLAN: -TYPE 2 DIABETES MELLITUS WITH DIABETIC NEUROPATHY: Your diabetes is well-controlled with an A1c of 6.9%, but the nerve damage from diabetes (neuropathy) is causing your leg pain. We recommend increasing your gabapentin  to at least twice daily, with the option to take it three times daily if needed. We also refilled your Jardiance  prescription.  -SUSPECTED PERIPHERAL ARTERIAL DISEASE OF THE LOWER EXTREMITIES: Your leg pain and weak foot pulses suggest that you might have peripheral arterial disease, which is a condition where the blood flow to your legs is reduced. We have ordered an arterial ultrasound to check the blood flow to your feet.  -ESSENTIAL HYPERTENSION: Your blood pressure has improved, but we need to make further adjustments. We have increased the strength of your amlodipine -valsartan  tablet to help better control your blood pressure.  INSTRUCTIONS: Please follow up with the arterial ultrasound as soon as possible to assess the blood flow to your feet. Continue taking your medications as prescribed and increase your gabapentin  to at least twice daily. If you have any new symptoms or concerns, please contact our office.

## 2023-11-23 NOTE — Assessment & Plan Note (Signed)
 Chronic and stable.  10-year ASCVD risk is elevated over 30%.  Previously was using simvastatin, then rosuvastatin, has been off this for several months.  He does not remember why his previous physicians discontinued this medicine.  Nothing documented in the chart.  No history of allergies or bad reactions.  Will restart rosuvastatin 20 mg daily.  Last LDL was 148 in April.  Goal LDL would be less than 100.  This is primary prevention.

## 2023-11-23 NOTE — Progress Notes (Signed)
 Established Patient Office Visit  Subjective   Patient ID: Danny Todd, male    DOB: 1955-03-09  Age: 68 y.o. MRN: [id number]  Chief Complaint  Patient presents with   Diabetes    3 month follow up   Eye Exam- would like to be referred to eye Dr  [place] all vaccines  Colonoscopy-declined     HPI  Discussed the use of AI scribe software for clinical note transcription with the patient, who gave verbal consent to proceed.  History of Present Illness Danny Todd is a 68 year old male with diabetes who presents with leg pain.  He experiences persistent aching pain in both legs, described as sharp and toothache-like. The pain occurs daily, improves with walking, but sometimes feels like 'restless legs' at night. The severity of the pain has prevented him from working. An ultrasound at urgent care ruled out blood clots. The pain is not located in the joints or knees but rather in the legs themselves.  He has diabetes and is currently taking Jardiance  and Ozempic . Despite his perception that the medication was not effective, his A1c has improved from 13% to 6.9% over time. He has lost 10 pounds in the last three months, now weighing 203 pounds. He is also on amlodipine  valsartan  for high blood pressure, which he takes daily. He takes gabapentin  300 mg once a day for nerve pain, although it is prescribed to be taken three times a day. He experiences some relief in the morning but stays up at night due to discomfort.  He mentions a supportive work environment and a desire to continue working despite his current health challenges.  No pain at the bottom of his feet, but sometimes experiences it. No burning pain but describes a sharp pain in his legs. No chest pain and reports staying up at night due to discomfort in his legs.      Objective:     BP 133/89   Pulse (!) 114   Wt 203 lb (92.1 kg)   BMI 29.98 kg/m   Physical Exam   Gen: Well-appearing man Neck: Normal  thyroid, no nodules or adenopathy Heart: Regular rhythm, mildly tachycardic, no murmur Lungs: Unlabored, clear throughout Ext: Warm, no edema, loss of hair on bilateral lower legs.  No ulcers or skin breakdown. Feet: Both feet have mild onychomycosis.  No callus or ulcerations.  Faint DP pulses, they are diminished.  Moderate neuropathy, decrease sensation to monofilament in both feet.   Results for orders placed or performed in visit on 11/23/23  POCT glycosylated hemoglobin (Hb A1C)  Result Value Ref Range   Hemoglobin A1C 6.9 (A) 4.0 - 5.6 %   HbA1c POC (<> result, manual entry)     HbA1c, POC (prediabetic range)     HbA1c, POC (controlled diabetic range)        Assessment & Plan:    Problem List Items Addressed This Visit       High   Hypertension, essential (Chronic)   Chronic and stable.  Well-controlled on full dose of amlodipine  and valsartan .  A little tachycardic today is a new finding.  Otherwise feeling well with no lightheadedness.  Will continue with Exforge  10-320 mg daily.  We talked about adherence to medication, seems to be doing better with consistency.      Relevant Medications   amLODipine -valsartan  (EXFORGE ) 10-320 MG tablet   rosuvastatin (CRESTOR) 20 MG tablet   Type 2 diabetes mellitus with diabetic neuropathy, unspecified (HCC) -  Primary (Chronic)   Chronic and much improved.  A1c is down to 6.9%.  This is a 2 point drop over the last 3 months, great to see.  He has complications of diabetes including neuropathy.  Will continue with semaglutide  1 mg weekly and Jardiance  10 mg daily.      Relevant Medications   amLODipine -valsartan  (EXFORGE ) 10-320 MG tablet   empagliflozin  (JARDIANCE ) 10 MG TABS tablet   rosuvastatin (CRESTOR) 20 MG tablet     Medium    Diabetic neuropathy (HCC) (Chronic)   Chronic issue.  Discomfort in his legs seems consistent with diabetic neuropathy.  He has been using gabapentin  300 mg once daily.  I recommended increasing it  to twice daily for 2 weeks.  If he needs more control, that can increase up to 3 times daily after that.      Relevant Medications   amLODipine -valsartan  (EXFORGE ) 10-320 MG tablet   empagliflozin  (JARDIANCE ) 10 MG TABS tablet   rosuvastatin (CRESTOR) 20 MG tablet   Hyperlipidemia associated with type 2 diabetes mellitus (HCC) (Chronic)   Chronic and stable.  10-year ASCVD risk is elevated over 30%.  Previously was using simvastatin, then rosuvastatin, has been off this for several months.  He does not remember why his previous physicians discontinued this medicine.  Nothing documented in the chart.  No history of allergies or bad reactions.  Will restart rosuvastatin 20 mg daily.  Last LDL was 148 in April.  Goal LDL would be less than 100.  This is primary prevention.      Relevant Medications   amLODipine -valsartan  (EXFORGE ) 10-320 MG tablet   empagliflozin  (JARDIANCE ) 10 MG TABS tablet   rosuvastatin (CRESTOR) 20 MG tablet     Unprioritized   Leg pain, bilateral   Chronic issue of bilateral leg pain, mix of features, seems somewhat consistent with neuropathy, but also sounds like it could be vascular.  Not function limiting at this time.  He has a history of uncontrolled diabetes, at risk for microvascular disease.  DP pulses are diminished in both feet.  Had an urgent care visit for this about 3 weeks ago, ultrasounds ruled out DVT.  I am going to check Dopplers to look for degree of peripheral artery disease.  I think he is at high risk for arterial calcifications.  Will restart rosuvastatin.  No aspirin  for now unless flow is diminished.      Relevant Orders   VAS US  LOWER EXT ART SEG MULTI (SEGMENTALS & LE RAYNAUDS)    Return in about 3 months (around 02/23/2024).    Cleatus Debby Specking, MD

## 2023-11-23 NOTE — Assessment & Plan Note (Signed)
 Chronic issue of bilateral leg pain, mix of features, seems somewhat consistent with neuropathy, but also sounds like it could be vascular.  Not function limiting at this time.  He has a history of uncontrolled diabetes, at risk for microvascular disease.  DP pulses are diminished in both feet.  Had an urgent care visit for this about 3 weeks ago, ultrasounds ruled out DVT.  I am going to check Dopplers to look for degree of peripheral artery disease.  I think he is at high risk for arterial calcifications.  Will restart rosuvastatin.  No aspirin  for now unless flow is diminished.

## 2023-11-23 NOTE — Assessment & Plan Note (Signed)
 Chronic and stable.  Well-controlled on full dose of amlodipine  and valsartan .  A little tachycardic today is a new finding.  Otherwise feeling well with no lightheadedness.  Will continue with Exforge  10-320 mg daily.  We talked about adherence to medication, seems to be doing better with consistency.

## 2023-11-24 ENCOUNTER — Ambulatory Visit: Admitting: Student in an Organized Health Care Education/Training Program

## 2023-11-28 ENCOUNTER — Ambulatory Visit (HOSPITAL_COMMUNITY)
Admission: RE | Admit: 2023-11-28 | Discharge: 2023-11-28 | Disposition: A | Discharge status: Home / Self Care | Source: Ambulatory Visit | Attending: Student in an Organized Health Care Education/Training Program | Admitting: Student in an Organized Health Care Education/Training Program

## 2023-11-28 DIAGNOSIS — M79605 Pain in left leg: Secondary | ICD-10-CM | POA: Diagnosis not present

## 2023-11-28 DIAGNOSIS — M79604 Pain in right leg: Secondary | ICD-10-CM | POA: Insufficient documentation

## 2023-11-29 ENCOUNTER — Ambulatory Visit: Payer: Self-pay | Admitting: Student in an Organized Health Care Education/Training Program

## 2023-11-29 ENCOUNTER — Telehealth: Payer: Self-pay

## 2023-11-29 NOTE — Telephone Encounter (Signed)
 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  Provider will determine charge.  Individual made aware of 3-5 business day turn around Yes?

## 2023-11-29 NOTE — Telephone Encounter (Signed)
 Placed forms on providers desk for review and sign

## 2023-11-30 LAB — VAS US LOWER EXT ART SEG MULTI (SEGMENTALS & LE RAYNAUDS)
Left ABI: 1.18
Right ABI: 1.19

## 2023-11-30 NOTE — Telephone Encounter (Signed)
 Forms completed.  Placed in MA basket.  Thank you.

## 2023-11-30 NOTE — Telephone Encounter (Signed)
 Forms placed in file cabinet for patient to pick up, called patient and he states he will be in today to pick them up

## 2023-12-01 ENCOUNTER — Other Ambulatory Visit: Payer: Self-pay

## 2023-12-01 DIAGNOSIS — E119 Type 2 diabetes mellitus without complications: Secondary | ICD-10-CM

## 2023-12-01 MED ORDER — SEMAGLUTIDE (1 MG/DOSE) 4 MG/3ML ~~LOC~~ SOPN: 1.0000 mg | PEN_INJECTOR | SUBCUTANEOUS | 2 refills | Status: AC

## 2023-12-11 NOTE — Telephone Encounter (Signed)
 Patient brought papers in on 12/08/2023 needing a signature at the bottom of page. Signature has been added and patient came in for pick up.

## 2023-12-20 ENCOUNTER — Telehealth: Payer: Self-pay

## 2023-12-20 NOTE — Telephone Encounter (Signed)
 On this patients paperwork has her leave meant to be for 5 hours or 5 days? I am happy to call them back and provide this update

## 2023-12-20 NOTE — Telephone Encounter (Signed)
 Copied from CRM [id number]. Topic: General - Other >> Dec 20, 2023  8:31 AM Franky GRADE wrote: Reason for CRM: [employer] Kingfisher HR rep for the patient is calling because paper work filled by Dr.Vincent has been received; however, not completed. She states in the paper work it states patient may miss up to 5 but does not specify if its an hour, day. Box was not checked off. She would like a call back to process documents [phone] ext [phone].

## 2023-12-21 NOTE — Telephone Encounter (Signed)
 Please ask them to update it to say 5 days please.  Thank you.

## 2023-12-21 NOTE — Telephone Encounter (Signed)
 [facility] and informed her that the intended unit was days no further information needed

## 2024-02-23 ENCOUNTER — Ambulatory Visit: Admitting: Student in an Organized Health Care Education/Training Program

## 2024-02-29 ENCOUNTER — Ambulatory Visit: Admitting: Student in an Organized Health Care Education/Training Program

## 2024-03-14 ENCOUNTER — Ambulatory Visit: Admitting: Student in an Organized Health Care Education/Training Program
